# Patient Record
Sex: Male | Born: 1980 | Race: White | Hispanic: No | Marital: Single | Smoking: Current every day smoker
Health system: Southern US, Community
[De-identification: ages and names within clinical notes are randomized; demographics above are authoritative.]

## PROBLEM LIST (undated history)

## (undated) DIAGNOSIS — I10 Essential (primary) hypertension: Secondary | ICD-10-CM

## (undated) DIAGNOSIS — K7290 Hepatic failure, unspecified without coma: Secondary | ICD-10-CM

## (undated) DIAGNOSIS — F1010 Alcohol abuse, uncomplicated: Secondary | ICD-10-CM

---

## 2013-08-23 ENCOUNTER — Emergency Department: Payer: Self-pay | Admitting: Emergency Medicine

## 2013-08-23 LAB — COMPREHENSIVE METABOLIC PANEL
ANION GAP: 6 — AB (ref 7–16)
Albumin: 3.7 g/dL (ref 3.4–5.0)
Alkaline Phosphatase: 91 U/L
BUN: 9 mg/dL (ref 7–18)
Bilirubin,Total: 0.2 mg/dL (ref 0.2–1.0)
CHLORIDE: 100 mmol/L (ref 98–107)
Calcium, Total: 8.8 mg/dL (ref 8.5–10.1)
Co2: 31 mmol/L (ref 21–32)
Creatinine: 0.94 mg/dL (ref 0.60–1.30)
EGFR (Non-African Amer.): >60
Glucose: 105 mg/dL — ABNORMAL HIGH (ref 65–99)
Osmolality: 273 (ref 275–301)
POTASSIUM: 5 mmol/L (ref 3.5–5.1)
SGOT(AST): 32 U/L (ref 15–37)
SGPT (ALT): 32 U/L
Sodium: 137 mmol/L (ref 136–145)
Total Protein: 8.8 g/dL — ABNORMAL HIGH (ref 6.4–8.2)

## 2013-08-23 LAB — URINALYSIS, COMPLETE
BLOOD: NEGATIVE
Bilirubin,UR: NEGATIVE
Glucose,UR: NEGATIVE mg/dL (ref 0–75)
KETONE: NEGATIVE
Leukocyte Esterase: NEGATIVE
Nitrite: NEGATIVE
PH: 5 (ref 4.5–8.0)
Protein: NEGATIVE
RBC,UR: NONE SEEN /HPF (ref 0–5)
Specific Gravity: 1.015 (ref 1.003–1.030)
Squamous Epithelial: NONE SEEN
WBC UR: NONE SEEN /HPF (ref 0–5)

## 2013-08-23 LAB — DRUG SCREEN, URINE
AMPHETAMINES, UR SCREEN: NEGATIVE (ref ?–1000)
BARBITURATES, UR SCREEN: NEGATIVE (ref ?–200)
BENZODIAZEPINE, UR SCRN: POSITIVE (ref ?–200)
Cannabinoid 50 Ng, Ur ~~LOC~~: NEGATIVE (ref ?–50)
Cocaine Metabolite,Ur ~~LOC~~: NEGATIVE (ref ?–300)
MDMA (Ecstasy)Ur Screen: NEGATIVE (ref ?–500)
Methadone, Ur Screen: POSITIVE (ref ?–300)
OPIATE, UR SCREEN: NEGATIVE (ref ?–300)
PHENCYCLIDINE (PCP) UR S: NEGATIVE (ref ?–25)
Tricyclic, Ur Screen: NEGATIVE (ref ?–1000)

## 2013-08-23 LAB — CBC
HCT: 46.3 % (ref 40.0–52.0)
HGB: 15.4 g/dL (ref 13.0–18.0)
MCH: 31.5 pg (ref 26.0–34.0)
MCHC: 33.3 g/dL (ref 32.0–36.0)
MCV: 95 fL (ref 80–100)
PLATELETS: 254 10*3/uL (ref 150–440)
RBC: 4.89 10*6/uL (ref 4.40–5.90)
RDW: 12.4 % (ref 11.5–14.5)
WBC: 9.8 10*3/uL (ref 3.8–10.6)

## 2013-08-23 LAB — SALICYLATE LEVEL: SALICYLATES, SERUM: 6.6 mg/dL — AB

## 2013-08-23 LAB — ETHANOL

## 2013-08-23 LAB — ACETAMINOPHEN LEVEL: ACETAMINOPHEN: 8 ug/mL — AB

## 2018-03-05 ENCOUNTER — Encounter (HOSPITAL_COMMUNITY): Payer: Self-pay | Admitting: Emergency Medicine

## 2018-03-05 ENCOUNTER — Other Ambulatory Visit: Payer: Self-pay

## 2018-03-05 ENCOUNTER — Inpatient Hospital Stay (HOSPITAL_COMMUNITY)
Admission: EM | Admit: 2018-03-05 | Discharge: 2018-03-12 | DRG: 432 | Disposition: A | Discharge status: Other Acute Inpatient Hospital | Payer: Medicaid Other | Source: Ambulatory Visit | Attending: Internal Medicine | Admitting: Internal Medicine

## 2018-03-05 DIAGNOSIS — R05 Cough: Secondary | ICD-10-CM | POA: Diagnosis not present

## 2018-03-05 DIAGNOSIS — Y95 Nosocomial condition: Secondary | ICD-10-CM | POA: Diagnosis not present

## 2018-03-05 DIAGNOSIS — E8770 Fluid overload, unspecified: Secondary | ICD-10-CM | POA: Diagnosis not present

## 2018-03-05 DIAGNOSIS — F1010 Alcohol abuse, uncomplicated: Secondary | ICD-10-CM | POA: Diagnosis not present

## 2018-03-05 DIAGNOSIS — K7011 Alcoholic hepatitis with ascites: Secondary | ICD-10-CM | POA: Diagnosis present

## 2018-03-05 DIAGNOSIS — E8809 Other disorders of plasma-protein metabolism, not elsewhere classified: Secondary | ICD-10-CM

## 2018-03-05 DIAGNOSIS — N289 Disorder of kidney and ureter, unspecified: Secondary | ICD-10-CM

## 2018-03-05 DIAGNOSIS — K920 Hematemesis: Secondary | ICD-10-CM | POA: Diagnosis present

## 2018-03-05 DIAGNOSIS — I10 Essential (primary) hypertension: Secondary | ICD-10-CM | POA: Diagnosis present

## 2018-03-05 DIAGNOSIS — Z978 Presence of other specified devices: Secondary | ICD-10-CM

## 2018-03-05 DIAGNOSIS — R6521 Severe sepsis with septic shock: Secondary | ICD-10-CM | POA: Diagnosis not present

## 2018-03-05 DIAGNOSIS — D689 Coagulation defect, unspecified: Secondary | ICD-10-CM | POA: Diagnosis present

## 2018-03-05 DIAGNOSIS — F1020 Alcohol dependence, uncomplicated: Secondary | ICD-10-CM

## 2018-03-05 DIAGNOSIS — D649 Anemia, unspecified: Secondary | ICD-10-CM | POA: Diagnosis present

## 2018-03-05 DIAGNOSIS — R34 Anuria and oliguria: Secondary | ICD-10-CM | POA: Diagnosis not present

## 2018-03-05 DIAGNOSIS — J9690 Respiratory failure, unspecified, unspecified whether with hypoxia or hypercapnia: Secondary | ICD-10-CM

## 2018-03-05 DIAGNOSIS — E871 Hypo-osmolality and hyponatremia: Secondary | ICD-10-CM | POA: Diagnosis present

## 2018-03-05 DIAGNOSIS — K7040 Alcoholic hepatic failure without coma: Principal | ICD-10-CM | POA: Diagnosis present

## 2018-03-05 DIAGNOSIS — F419 Anxiety disorder, unspecified: Secondary | ICD-10-CM | POA: Diagnosis present

## 2018-03-05 DIAGNOSIS — N179 Acute kidney failure, unspecified: Secondary | ICD-10-CM | POA: Diagnosis present

## 2018-03-05 DIAGNOSIS — R059 Cough, unspecified: Secondary | ICD-10-CM

## 2018-03-05 DIAGNOSIS — S025XXA Fracture of tooth (traumatic), initial encounter for closed fracture: Secondary | ICD-10-CM | POA: Diagnosis present

## 2018-03-05 DIAGNOSIS — B1920 Unspecified viral hepatitis C without hepatic coma: Secondary | ICD-10-CM | POA: Diagnosis present

## 2018-03-05 DIAGNOSIS — R188 Other ascites: Secondary | ICD-10-CM

## 2018-03-05 DIAGNOSIS — K7031 Alcoholic cirrhosis of liver with ascites: Secondary | ICD-10-CM | POA: Diagnosis present

## 2018-03-05 DIAGNOSIS — K7290 Hepatic failure, unspecified without coma: Secondary | ICD-10-CM | POA: Diagnosis not present

## 2018-03-05 DIAGNOSIS — D6959 Other secondary thrombocytopenia: Secondary | ICD-10-CM | POA: Diagnosis present

## 2018-03-05 DIAGNOSIS — J189 Pneumonia, unspecified organism: Secondary | ICD-10-CM | POA: Diagnosis not present

## 2018-03-05 DIAGNOSIS — J9601 Acute respiratory failure with hypoxia: Secondary | ICD-10-CM | POA: Diagnosis not present

## 2018-03-05 DIAGNOSIS — K921 Melena: Secondary | ICD-10-CM | POA: Diagnosis present

## 2018-03-05 DIAGNOSIS — A419 Sepsis, unspecified organism: Secondary | ICD-10-CM | POA: Diagnosis not present

## 2018-03-05 DIAGNOSIS — F17210 Nicotine dependence, cigarettes, uncomplicated: Secondary | ICD-10-CM | POA: Diagnosis present

## 2018-03-05 DIAGNOSIS — F1190 Opioid use, unspecified, uncomplicated: Secondary | ICD-10-CM | POA: Diagnosis present

## 2018-03-05 DIAGNOSIS — K279 Peptic ulcer, site unspecified, unspecified as acute or chronic, without hemorrhage or perforation: Secondary | ICD-10-CM | POA: Diagnosis present

## 2018-03-05 DIAGNOSIS — E860 Dehydration: Secondary | ICD-10-CM | POA: Diagnosis present

## 2018-03-05 DIAGNOSIS — K029 Dental caries, unspecified: Secondary | ICD-10-CM | POA: Diagnosis present

## 2018-03-05 DIAGNOSIS — I361 Nonrheumatic tricuspid (valve) insufficiency: Secondary | ICD-10-CM | POA: Diagnosis not present

## 2018-03-05 HISTORY — DX: Essential (primary) hypertension: I10

## 2018-03-05 HISTORY — DX: Hepatic failure, unspecified without coma: K72.90

## 2018-03-05 HISTORY — DX: Alcohol abuse, uncomplicated: F10.10

## 2018-03-05 LAB — CBC
HCT: 31.1 % — ABNORMAL LOW (ref 39.0–52.0)
Hemoglobin: 11.5 g/dL — ABNORMAL LOW (ref 13.0–17.0)
MCH: 35.6 pg — ABNORMAL HIGH (ref 26.0–34.0)
MCHC: 37 g/dL — ABNORMAL HIGH (ref 30.0–36.0)
MCV: 96.3 fL (ref 80.0–100.0)
NRBC: 0 % (ref 0.0–0.2)
Platelets: 107 10*3/uL — ABNORMAL LOW (ref 150–400)
RBC: 3.23 MIL/uL — ABNORMAL LOW (ref 4.22–5.81)
RDW: 16.1 % — ABNORMAL HIGH (ref 11.5–15.5)
WBC: 12.5 10*3/uL — AB (ref 4.0–10.5)

## 2018-03-05 LAB — BASIC METABOLIC PANEL
Anion gap: 12 (ref 5–15)
BUN: 27 mg/dL — ABNORMAL HIGH (ref 6–20)
CO2: 24 mmol/L (ref 22–32)
Calcium: 8.4 mg/dL — ABNORMAL LOW (ref 8.9–10.3)
Chloride: 80 mmol/L — ABNORMAL LOW (ref 98–111)
Creatinine, Ser: 3.11 mg/dL — ABNORMAL HIGH (ref 0.61–1.24)
GFR calc Af Amer: 28 mL/min — ABNORMAL LOW (ref >60)
GFR calc non Af Amer: 24 mL/min — ABNORMAL LOW (ref >60)
GLUCOSE: 98 mg/dL (ref 70–99)
Potassium: 4.8 mmol/L (ref 3.5–5.1)
Sodium: 116 mmol/L — CL (ref 135–145)

## 2018-03-05 LAB — TYPE AND SCREEN
ABO/RH(D): A POS
Antibody Screen: NEGATIVE

## 2018-03-05 LAB — MAGNESIUM: MAGNESIUM: 2.2 mg/dL (ref 1.7–2.4)

## 2018-03-05 LAB — COMPREHENSIVE METABOLIC PANEL
ALT: 91 U/L — ABNORMAL HIGH (ref 0–44)
AST: 282 U/L — ABNORMAL HIGH (ref 15–41)
Albumin: 2.3 g/dL — ABNORMAL LOW (ref 3.5–5.0)
Alkaline Phosphatase: 101 U/L (ref 38–126)
Anion gap: 13 (ref 5–15)
BUN: 27 mg/dL — ABNORMAL HIGH (ref 6–20)
CO2: 22 mmol/L (ref 22–32)
Calcium: 8.5 mg/dL — ABNORMAL LOW (ref 8.9–10.3)
Chloride: 80 mmol/L — ABNORMAL LOW (ref 98–111)
Creatinine, Ser: 3.02 mg/dL — ABNORMAL HIGH (ref 0.61–1.24)
GFR calc Af Amer: 29 mL/min — ABNORMAL LOW (ref >60)
GFR, EST NON AFRICAN AMERICAN: 25 mL/min — AB (ref >60)
Glucose, Bld: 108 mg/dL — ABNORMAL HIGH (ref 70–99)
Potassium: 4.7 mmol/L (ref 3.5–5.1)
Sodium: 115 mmol/L — CL (ref 135–145)
Total Bilirubin: 22.6 mg/dL (ref 0.3–1.2)
Total Protein: 7.1 g/dL (ref 6.5–8.1)

## 2018-03-05 LAB — BILIRUBIN, FRACTIONATED(TOT/DIR/INDIR)
Bilirubin, Direct: 13.6 mg/dL — ABNORMAL HIGH (ref 0.0–0.2)
Indirect Bilirubin: 8 mg/dL — ABNORMAL HIGH (ref 0.3–0.9)
Total Bilirubin: 21.6 mg/dL (ref 0.3–1.2)

## 2018-03-05 LAB — TSH: TSH: 1.771 u[IU]/mL (ref 0.350–4.500)

## 2018-03-05 LAB — PHOSPHORUS: Phosphorus: 5.6 mg/dL — ABNORMAL HIGH (ref 2.5–4.6)

## 2018-03-05 LAB — ETHANOL: Alcohol, Ethyl (B): 22 mg/dL — ABNORMAL HIGH (ref <10)

## 2018-03-05 MED ORDER — ACETAMINOPHEN 650 MG RE SUPP: 650.0000 mg | Freq: Four times a day (QID) | RECTAL | Status: DC | PRN

## 2018-03-05 MED ORDER — FOLIC ACID 1 MG PO TABS
1.0000 mg | ORAL_TABLET | Freq: Every day | ORAL | Status: DC
  Administered 2018-03-06 – 2018-03-12 (×6): 1 mg via ORAL
  Filled 2018-03-05 (×8): qty 1

## 2018-03-05 MED ORDER — METHADONE HCL 10 MG/ML PO CONC
50.0000 mg | Freq: Every morning | ORAL | Status: DC
  Filled 2018-03-05: qty 5

## 2018-03-05 MED ORDER — ACETAMINOPHEN 325 MG PO TABS: 650.0000 mg | ORAL_TABLET | Freq: Four times a day (QID) | ORAL | Status: DC | PRN

## 2018-03-05 MED ORDER — ADULT MULTIVITAMIN W/MINERALS CH
1.0000 | ORAL_TABLET | Freq: Every day | ORAL | Status: DC
  Administered 2018-03-06 – 2018-03-12 (×7): 1 via ORAL
  Filled 2018-03-05 (×6): qty 1

## 2018-03-05 MED ORDER — THIAMINE HCL 100 MG/ML IJ SOLN
100.0000 mg | Freq: Every day | INTRAMUSCULAR | Status: DC
  Administered 2018-03-06: 100 mg via INTRAVENOUS
  Filled 2018-03-05: qty 2

## 2018-03-05 MED ORDER — PANTOPRAZOLE SODIUM 40 MG IV SOLR
40.0000 mg | Freq: Two times a day (BID) | INTRAVENOUS | Status: DC
  Administered 2018-03-05 – 2018-03-08 (×6): 40 mg via INTRAVENOUS
  Filled 2018-03-05 (×6): qty 40

## 2018-03-05 MED ORDER — LORAZEPAM 2 MG/ML IJ SOLN
0.0000 mg | Freq: Two times a day (BID) | INTRAMUSCULAR | Status: AC
  Administered 2018-03-08: 2 mg via INTRAVENOUS
  Filled 2018-03-05 (×2): qty 1

## 2018-03-05 MED ORDER — SODIUM CHLORIDE 0.9 % IV BOLUS
1000.0000 mL | Freq: Once | INTRAVENOUS | Status: AC
  Administered 2018-03-05: 1000 mL via INTRAVENOUS

## 2018-03-05 MED ORDER — METHYLPREDNISOLONE SODIUM SUCC 40 MG IJ SOLR
40.0000 mg | Freq: Every day | INTRAMUSCULAR | Status: DC
  Administered 2018-03-05: 40 mg via INTRAVENOUS
  Filled 2018-03-05: qty 1

## 2018-03-05 MED ORDER — LORAZEPAM 2 MG/ML IJ SOLN
0.0000 mg | Freq: Four times a day (QID) | INTRAMUSCULAR | Status: AC
  Administered 2018-03-06: 2 mg via INTRAVENOUS
  Administered 2018-03-07 (×2): 1 mg via INTRAVENOUS
  Filled 2018-03-05 (×3): qty 1

## 2018-03-05 MED ORDER — LORAZEPAM 1 MG PO TABS
1.0000 mg | ORAL_TABLET | Freq: Four times a day (QID) | ORAL | Status: AC | PRN
  Administered 2018-03-08: 1 mg via ORAL
  Filled 2018-03-05: qty 1

## 2018-03-05 MED ORDER — ONDANSETRON HCL 4 MG/2ML IJ SOLN
4.0000 mg | Freq: Four times a day (QID) | INTRAMUSCULAR | Status: DC | PRN
  Administered 2018-03-06 – 2018-03-10 (×4): 4 mg via INTRAVENOUS
  Filled 2018-03-05 (×4): qty 2

## 2018-03-05 MED ORDER — SODIUM CHLORIDE 0.9 % IV SOLN
1.0000 g | INTRAVENOUS | Status: DC
  Administered 2018-03-06 – 2018-03-08 (×4): 1 g via INTRAVENOUS
  Filled 2018-03-05 (×4): qty 10

## 2018-03-05 MED ORDER — DEXTROSE-NACL 5-0.9 % IV SOLN
INTRAVENOUS | Status: DC
  Administered 2018-03-06 – 2018-03-07 (×3): via INTRAVENOUS

## 2018-03-05 MED ORDER — CITALOPRAM HYDROBROMIDE 20 MG PO TABS: 40.0000 mg | ORAL_TABLET | Freq: Every day | ORAL | Status: DC

## 2018-03-05 MED ORDER — VITAMIN B-1 100 MG PO TABS
100.0000 mg | ORAL_TABLET | Freq: Every day | ORAL | Status: DC
  Administered 2018-03-07 – 2018-03-12 (×6): 100 mg via ORAL
  Filled 2018-03-05 (×7): qty 1

## 2018-03-05 MED ORDER — ONDANSETRON HCL 4 MG PO TABS: 4.0000 mg | ORAL_TABLET | Freq: Four times a day (QID) | ORAL | Status: DC | PRN

## 2018-03-05 MED ORDER — SODIUM CHLORIDE 0.9 % IV SOLN
INTRAVENOUS | Status: DC
  Administered 2018-03-05: 18:00:00 via INTRAVENOUS

## 2018-03-05 MED ORDER — POLYETHYLENE GLYCOL 3350 17 G PO PACK: 17.0000 g | PACK | Freq: Every day | ORAL | Status: DC | PRN

## 2018-03-05 MED ORDER — LORAZEPAM 2 MG/ML IJ SOLN
1.0000 mg | Freq: Four times a day (QID) | INTRAMUSCULAR | Status: AC | PRN
  Administered 2018-03-05 – 2018-03-07 (×3): 1 mg via INTRAVENOUS
  Filled 2018-03-05 (×3): qty 1

## 2018-03-05 NOTE — H&P (Signed)
History and Physical    Momen Grosso KMQ:[id number] DOB: 08-26-1980 DOA: 03/05/2018  PCP: Smith Robert, MD   Patient coming from: Home  I have personally briefly reviewed patient's old medical records in [facility]  Chief Complaint: Jaundice, vomiting blood,    HPI: Azahel Rish is a 38 y.o. male with medical history significant for alcohol abuse, HTN, was sent to the ED by his primary care provider after blood work showed low sodium.  Patient reports noticing skin color change about 2 weeks ago, reports vomiting blood last episode this morning 3 times, black stools and loose stools over the past month.  Averages 3-4 stools daily.  Reports poor p.o. intake and fatigue over the past month.  Reports gradual abdominal swelling with tightness over the past 3 months, without leg swelling, no difficulty breathing.  He reports abdominal pain right upper quadrant and epigastric area. No chest pain, no difficulty breathing.  No seizures, no confusion.  Patient lives with his father who is present at bedside.  Patient admits to drinking at least 10 beers every day.  He tells me his last drink was yesterday- 1 beer, because he is trying to cut down.  He started drinking at the age of 78.  He drinks to avoid going into withdrawal. Has been on methadone since 2015, goes to a pain clinic in [place], [facility].  Denies IV drug use.  ED Course: soft blood pressure-systolic 102. Hyponatremia 115, elevated creatinine 3, hyperbilirubinemia- 22, elevated AST 282, ALT 91, Normal ALP 101. Hgb 11.5.  WBC 12.5.  Platelets 107.  Started on normal saline at 125 cc/h.  At the time of my evaluation, patient had barely gotten 500 mill total after ~5 hrs. EDP to Dr. Darrick Penna, OK with admission here, for treatment of alcoholic hepatitis.  Review of Systems: As per HPI all other systems reviewed and negative.  Past Medical History:  Diagnosis Date  . Alcohol abuse   . Hypertension   .  Liver failure (HCC)     History reviewed. No pertinent surgical history.   reports that he has been smoking cigarettes. He has been smoking about 1.50 packs per day. He has never used smokeless tobacco. He reports current alcohol use. He reports previous drug use.  No Known Allergies  No family history of liver disease.  Prior to Admission medications   Medication Sig Start Date End Date Taking? Authorizing Provider  acetaminophen (TYLENOL) 500 MG tablet Take 500-1,000 mg by mouth daily as needed for headache.   Yes [provider]  citalopram (CELEXA) 40 MG tablet Take 40 mg by mouth daily. 02/22/18  Yes [provider]  lisinopril (PRINIVIL,ZESTRIL) 20 MG tablet Take 20 mg by mouth daily. 02/22/18  Yes [provider]  LORazepam (ATIVAN) 1 MG tablet Take 1 mg by mouth daily as needed for anxiety.  02/22/18  Yes [provider]  methadone (DOLOPHINE) 10 MG/ML solution Take 50 mg by mouth every morning.   Yes [provider]  Vitamin D, Ergocalciferol, (DRISDOL) 1.25 MG (50000 UT) CAPS capsule Take 1 capsule by mouth every Sunday.    Yes [provider]    Physical Exam: Vitals:   03/05/18 1800 03/05/18 1830 03/05/18 1900 03/05/18 2000  BP: 109/62 108/62 121/78 (!) 106/59  Pulse: 83 84 92 85  Resp: (!) 8 (!) 9 14 14   Temp:      TempSrc:      SpO2: 92% 91% 93% 91%  Weight:  Height:        Constitutional: calm, comfortable, Icteric, Chronically ill-appearing Vitals:   03/05/18 1800 03/05/18 1830 03/05/18 1900 03/05/18 2000  BP: 109/62 108/62 121/78 (!) 106/59  Pulse: 83 84 92 85  Resp: (!) 8 (!) 9 14 14   Temp:      TempSrc:      SpO2: 92% 91% 93% 91%  Weight:      Height:       Eyes: PERRL, lids normal,, icteric conjunctiva ENMT: Mucous membranes are dry. Posterior pharynx clear of any exudate or lesions.  Neck: normal, supple, no masses, no thyromegaly Respiratory: clear to auscultation bilaterally, no wheezing, no  crackles. Normal respiratory effort. No accessory muscle use.  Cardiovascular: Regular rate and rhythm, no murmurs / rubs / gallops. No extremity edema. 2+ pedal pulses. No carotid bruits.  Abdomen: Distended, no tenderness, unable to palpate internal organs. Bowel sounds positive.  Musculoskeletal: no clubbing / cyanosis. No joint deformity upper and lower extremities. Good ROM, no contractures. Normal muscle tone.  Skin: no rashes, lesions, ulcers. No induration Neurologic: CN 2-12 grossly intact.  Strength 5/5 in all 4.  Psychiatric: Normal judgment and insight. Alert and oriented x 3. Normal mood.   Labs on Admission: I have personally reviewed following labs and imaging studies  CBC: Recent Labs  Lab 03/05/18 1610  WBC 12.5*  HGB 11.5*  HCT 31.1*  MCV 96.3  PLT 107*   Basic Metabolic Panel: Recent Labs  Lab 03/05/18 1610  NA 115*  K 4.7  CL 80*  CO2 22  GLUCOSE 108*  BUN 27*  CREATININE 3.02*  CALCIUM 8.5*   Liver Function Tests: Recent Labs  Lab 03/05/18 1610  AST 282*  ALT 91*  ALKPHOS 101  BILITOT 22.6*  PROT 7.1  ALBUMIN 2.3*    Radiological Exams on Admission: No results found.  EKG: None   Assessment/Plan Active Problems:   Hyponatremia   Hyponatremia-sodium 115 > 116, after barely of N/s.  Asymptomatic. per Care everywhere sodium 08/2017-  135.  Differentials likely prerenal vs beer portal mania.  He appears dehydrated clinically from vomiting and loose stools but has marked abdominal distention likely from ascites.   -1 L bolus -Cont D5 Normal saline at 125cc/hr  -Urine osmolality -Serum osmolality -Urine Sodium -TSH - BMP q6H X 4  Acute kidney injury- Cr 3. 08/2017- Cr 0.69.  Likely prerenal, with ongoing lisinopril use.  Differentials include hepatorenal syndrome. -Hydrate - BMP q6H x 4  Alcoholic hepatitis Vs liver failure-bilirubin 22.  With vomiting of blood, melena, Abdominal distention-ascites. ED provider talked to GI, okay  with admission here, treat for alcoholic hepatitis -Gastroenterology consult- order placed - NPO -Right upper quadrant ultrasound - Would benefit from paracentesis after RUQ [place] -IV Protonix 40 twice daily -PT- INR -Fractionated bilirubin -Acute hepatitis panel -IV Solu-Medrol 40 daily -Liver panel a.m. -Ceftriaxone 1 g daily for SBP prophylaxis in the setting of possible upper GI bleed - CBC a.m  Alcohol abuse- high risk for withdrawal.  Last drink yesterday.  But serum alcohol level is 22.  Denies seizure history. 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 Pain Clinic- [facility] number [phone]. -Continue home methadone - UDS  HTN-Hypotensive to soft blood pressure -Hold Lisinopril  HIV as part of routine health screening  DVT prophylaxis: Scds Code Status: Full Family Communication: Father at bedside Disposition Plan: Per rounding team Consults called: GI  Admission status: Inpt, Stepdown   Onnie Boer MD [facility]  03/05/2018, 11:58 PM

## 2018-03-05 NOTE — ED Provider Notes (Signed)
[facility] EMERGENCY DEPARTMENT Provider Note   CSN: [id number] Arrival date & time: 03/05/18  1537    History   Chief Complaint Chief Complaint  Patient presents with  . Jaundice    HPI Charles Hebert is a 38 y.o. male.     HPI   Patient states his doctor sent him here for evaluation of worsening ascites, worsening liver failure, and low sodium.  He had labs done yesterday at his PCP's office.  Patient reports mild soreness in abdomen.  He denies shortness of breath leg edema, focal weakness or paresthesia.  He has not had any fever, cough or chest pain.  He is taking a blood pressure medication as his only prescription.  He is unsure how long he has had liver problems.  Patient has been previously noted to be an alcoholic.  There are no other known modifying factors. Past Medical History:  Diagnosis Date  . Alcohol abuse   . Hypertension   . Liver failure (HCC)     There are no active problems to display for this patient.   History reviewed. No pertinent surgical history.      Home Medications    Prior to Admission medications   Not on File    Family History No family history on file.  Social History Social History   Tobacco Use  . Smoking status: Current Every Day Smoker    Packs/day: 1.50    Types: Cigarettes  . Smokeless tobacco: Never Used  Substance Use Topics  . Alcohol use: Yes    Comment: pt reports drinking #10 16oz cans of red apple ales daily, heavy for past 5 years  . Drug use: Not Currently    Comment: hx opiate abuse, on methadones     Allergies   Patient has no known allergies.   Review of Systems Review of Systems  All other systems reviewed and are negative.    Physical Exam Updated Vital Signs BP 121/78   Pulse 92   Temp 97.6 F (36.4 C) (Oral)   Resp 14   Ht 6' (1.829 m)   Wt 95.3 kg   SpO2 93%   BMI 28.48 kg/m    Physical Exam Vitals signs and nursing note reviewed.  Constitutional:      General: He is not  in acute distress.    Appearance: He is well-developed. He is ill-appearing. He is not toxic-appearing or diaphoretic.     Comments: Appears older than stated age.  HENT:     Head: Normocephalic and atraumatic.     Right Ear: External ear normal.     Left Ear: External ear normal.     Nose: No congestion or rhinorrhea.     Mouth/Throat:     Mouth: Mucous membranes are moist.     Pharynx: No oropharyngeal exudate or posterior oropharyngeal erythema.  Eyes:     General: Scleral icterus present.     Conjunctiva/sclera: Conjunctivae normal.     Pupils: Pupils are equal, round, and reactive to light.  Neck:     Musculoskeletal: Normal range of motion and neck supple.     Trachea: Phonation normal.  Cardiovascular:     Rate and Rhythm: Normal rate and regular rhythm.     Heart sounds: Normal heart sounds.  Pulmonary:     Effort: Pulmonary effort is normal.     Breath sounds: Normal breath sounds.  Abdominal:     General: There is distension.     Palpations: Abdomen is soft.  There is no mass.     Tenderness: There is no abdominal tenderness. There is no rebound.     Hernia: No hernia is present.  Musculoskeletal: Normal range of motion.     Right lower leg: No edema.     Left lower leg: No edema.  Skin:    General: Skin is warm and dry.  Neurological:     Mental Status: He is alert and oriented to person, place, and time.     Cranial Nerves: No cranial nerve deficit.     Sensory: No sensory deficit.     Motor: No abnormal muscle tone.     Coordination: Coordination normal.     Comments: No dysarthria or aphasia  Psychiatric:        Behavior: Behavior normal.        Thought Content: Thought content normal.        Judgment: Judgment normal.      ED Treatments / Results  Labs (all labs ordered are listed, but only abnormal results are displayed) Labs Reviewed  COMPREHENSIVE METABOLIC PANEL - Abnormal; Notable for the following components:      Result Value   Sodium 115  (*)    Chloride 80 (*)    Glucose, Bld 108 (*)    BUN 27 (*)    Creatinine, Ser 3.02 (*)    Calcium 8.5 (*)    Albumin 2.3 (*)    AST 282 (*)    ALT 91 (*)    Total Bilirubin 22.6 (*)    GFR calc non Af Amer 25 (*)    GFR calc Af Amer 29 (*)    All other components within normal limits  CBC - Abnormal; Notable for the following components:   WBC 12.5 (*)    RBC 3.23 (*)    Hemoglobin 11.5 (*)    HCT 31.1 (*)    MCH 35.6 (*)    MCHC 37.0 (*)    RDW 16.1 (*)    Platelets 107 (*)    All other components within normal limits  POC OCCULT BLOOD, ED  TYPE AND SCREEN    EKG None  Radiology No results found.  Procedures .Critical Care  Performed by: Mancel Bale, MD  Authorized by: Mancel Bale, MD   Critical care provider statement:    Critical care time (minutes):  35   Critical care start time:  03/05/2018 4:02 PM   Critical care end time:  03/05/2018 5:42 PM   Critical care time was exclusive of:  Separately billable procedures and treating other patients   Critical care was necessary to treat or prevent imminent or life-threatening deterioration of the following conditions:  Hepatic failure   Critical care was time spent personally by me on the following activities:  Blood draw for specimens, development of treatment plan with patient or surrogate, discussions with consultants, evaluation of patient's response to treatment, examination of patient, obtaining history from patient or surrogate, ordering and performing treatments and interventions, ordering and review of laboratory studies, pulse oximetry, re-evaluation of patient's condition, review of old charts and ordering and review of radiographic studies   (including critical care time)  Medications Ordered in ED Medications  0.9 %  sodium chloride infusion ( Intravenous New Bag/Given 03/05/18 1756)     Initial Impression / Assessment and Plan / ED Course  I have reviewed the triage vital signs and the nursing  notes.  Pertinent labs & imaging results that were available during my care of  the patient were reviewed by me and considered in my medical decision making (see chart for details).  Clinical Course as of Mar 05 1912  Tue Mar 05, 2018  1735 Normal except sodium low, chloride low, glucose low, BUN low, creatinine high, calcium low, albumin low, AST high, ALT high, total bilirubin high, GFR low  Comprehensive metabolic panel(!!) [EW]  1735 Normal except white count high, hemoglobin low, RBC indices low, platelets low  CBC(!) [EW]  1913 Noted  Type and screen [facility] [EW]    Clinical Course User Index [EW] Mancel BaleWentz, Lashunda Greis, MD        Patient Vitals for the past 24 hrs:  BP Temp Temp src Pulse Resp SpO2 Height Weight  03/05/18 1900 121/78 - - 92 14 93 % - -  03/05/18 1830 108/62 - - 84 (!) 9 91 % - -  03/05/18 1800 109/62 - - 83 (!) 8 92 % - -  03/05/18 1730 113/65 - - 88 10 93 % - -  03/05/18 1553 - - - - - - 6' (1.829 m) 95.3 kg  03/05/18 1552 102/71 97.6 F (36.4 C) Oral 93 16 94 % - -    5:36 PM Reevaluation with update and discussion. After initial assessment and treatment, an updated evaluation reveals he has been able ambulate here.  He has no further complaints.  Findings discussed and questions answered. Mancel BaleElliott Teylor Wolven   Medical Decision Making: Liver failure, nonspecific, apparently been treated by PCP without improvement.  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  Doubt acute active progressive hepatitis.  Patient with significant hyponatremia, hypochloremia, and renal insufficiency.  He is likely overall vascular volume depleted.  Records requested from patient's PCP not available as of  5:30 p.m. patient takes an unknown antihypertensive, unknown if it is a diuretic.  Patient with very low sodium level that needs supplementation with likely overall volume depletion, evidenced by increased BUN and creatinine.  Doubt acute hepatorenal syndrome.   CRITICAL  CARE- yes Performed by: Mancel BaleElliott Normal Recinos   Nursing Notes Reviewed/ Care Coordinated Applicable Imaging Reviewed Interpretation of Laboratory Data incorporated into ED treatment   5:38 PM-Consult complete with hospitalist. Patient case explained and discussed.  She agrees to admit patient for further evaluation and treatment. Call ended at 1820  Case discussed with gastroenterology, Dr. Darrick Pennafields, who states that the patient can be managed here at this facility for care and treatment of alcohol related hepatitis.  Plan: Admit  Final Clinical Impressions(s) / ED Diagnoses   Final diagnoses:  Hyperbilirubinemia  Hyponatremia  Renal insufficiency  Hypoalbuminemia  Alcoholism (HCC)  Ascites due to alcoholic hepatitis    ED Discharge Orders    None      Mancel BaleWentz, Javante Nilsson, MD 03/05/18 1914

## 2018-03-05 NOTE — ED Notes (Signed)
CRITICAL VALUE ALERT  Critical Value:  Na 115, total Bilirubin 22.6  Date & Time Notied:  1711, 03/05/2018  Provider Notified: Dr. Effie Shy  Orders Received/Actions taken: no new orders

## 2018-03-05 NOTE — ED Triage Notes (Addendum)
Pt with hx of liver failure presents with jaundice (began 2-3 days ago), vomiting blood and bile x 1.5 months, blood in stools, loose stools, fatigue, and loss of appetite. Pt states PCP told him to come because his sodium is "extremely low."

## 2018-03-05 NOTE — ED Notes (Signed)
CRITICAL VALUE ALERT  Critical Value:  Sodium 116  Date & Time Notied:  03/05/2018 2102  Provider Notified: Dr Mariea Clonts  Orders Received/Actions taken: see chart

## 2018-03-06 ENCOUNTER — Inpatient Hospital Stay (HOSPITAL_COMMUNITY): Payer: Medicaid Other

## 2018-03-06 ENCOUNTER — Encounter (HOSPITAL_COMMUNITY): Payer: Self-pay

## 2018-03-06 DIAGNOSIS — F1010 Alcohol abuse, uncomplicated: Secondary | ICD-10-CM

## 2018-03-06 DIAGNOSIS — E871 Hypo-osmolality and hyponatremia: Secondary | ICD-10-CM

## 2018-03-06 DIAGNOSIS — K7290 Hepatic failure, unspecified without coma: Secondary | ICD-10-CM

## 2018-03-06 LAB — BASIC METABOLIC PANEL
Anion gap: 11 (ref 5–15)
Anion gap: 9 (ref 5–15)
Anion gap: 9 (ref 5–15)
Anion gap: 9 (ref 5–15)
BUN: 26 mg/dL — AB (ref 6–20)
BUN: 27 mg/dL — ABNORMAL HIGH (ref 6–20)
BUN: 27 mg/dL — ABNORMAL HIGH (ref 6–20)
BUN: 28 mg/dL — AB (ref 6–20)
CHLORIDE: 89 mmol/L — AB (ref 98–111)
CHLORIDE: 91 mmol/L — AB (ref 98–111)
CO2: 20 mmol/L — ABNORMAL LOW (ref 22–32)
CO2: 21 mmol/L — ABNORMAL LOW (ref 22–32)
CO2: 22 mmol/L (ref 22–32)
CO2: 24 mmol/L (ref 22–32)
Calcium: 8.1 mg/dL — ABNORMAL LOW (ref 8.9–10.3)
Calcium: 8.1 mg/dL — ABNORMAL LOW (ref 8.9–10.3)
Calcium: 8.1 mg/dL — ABNORMAL LOW (ref 8.9–10.3)
Calcium: 8.2 mg/dL — ABNORMAL LOW (ref 8.9–10.3)
Chloride: 84 mmol/L — ABNORMAL LOW (ref 98–111)
Chloride: 85 mmol/L — ABNORMAL LOW (ref 98–111)
Creatinine, Ser: 2.07 mg/dL — ABNORMAL HIGH (ref 0.61–1.24)
Creatinine, Ser: 2.38 mg/dL — ABNORMAL HIGH (ref 0.61–1.24)
Creatinine, Ser: 2.78 mg/dL — ABNORMAL HIGH (ref 0.61–1.24)
Creatinine, Ser: 3.11 mg/dL — ABNORMAL HIGH (ref 0.61–1.24)
GFR calc Af Amer: 28 mL/min — ABNORMAL LOW (ref >60)
GFR calc Af Amer: 32 mL/min — ABNORMAL LOW (ref >60)
GFR calc Af Amer: 39 mL/min — ABNORMAL LOW (ref >60)
GFR calc Af Amer: 46 mL/min — ABNORMAL LOW (ref >60)
GFR calc non Af Amer: 24 mL/min — ABNORMAL LOW (ref >60)
GFR calc non Af Amer: 28 mL/min — ABNORMAL LOW (ref >60)
GFR calc non Af Amer: 33 mL/min — ABNORMAL LOW (ref >60)
GFR calc non Af Amer: 39 mL/min — ABNORMAL LOW (ref >60)
Glucose, Bld: 134 mg/dL — ABNORMAL HIGH (ref 70–99)
Glucose, Bld: 154 mg/dL — ABNORMAL HIGH (ref 70–99)
Glucose, Bld: 161 mg/dL — ABNORMAL HIGH (ref 70–99)
Glucose, Bld: 97 mg/dL (ref 70–99)
POTASSIUM: 5.1 mmol/L (ref 3.5–5.1)
Potassium: 4.8 mmol/L (ref 3.5–5.1)
Potassium: 4.8 mmol/L (ref 3.5–5.1)
Potassium: 4.9 mmol/L (ref 3.5–5.1)
Sodium: 117 mmol/L — CL (ref 135–145)
Sodium: 118 mmol/L — CL (ref 135–145)
Sodium: 119 mmol/L — CL (ref 135–145)
Sodium: 120 mmol/L — ABNORMAL LOW (ref 135–145)

## 2018-03-06 LAB — RAPID URINE DRUG SCREEN, HOSP PERFORMED
Amphetamines: NOT DETECTED
Barbiturates: NOT DETECTED
Benzodiazepines: POSITIVE — AB
Cocaine: NOT DETECTED
Opiates: NOT DETECTED
TETRAHYDROCANNABINOL: NOT DETECTED

## 2018-03-06 LAB — CBC
HCT: 29.9 % — ABNORMAL LOW (ref 39.0–52.0)
HEMOGLOBIN: 11 g/dL — AB (ref 13.0–17.0)
MCH: 35.9 pg — ABNORMAL HIGH (ref 26.0–34.0)
MCHC: 36.8 g/dL — ABNORMAL HIGH (ref 30.0–36.0)
MCV: 97.7 fL (ref 80.0–100.0)
Platelets: 94 10*3/uL — ABNORMAL LOW (ref 150–400)
RBC: 3.06 MIL/uL — ABNORMAL LOW (ref 4.22–5.81)
RDW: 15.7 % — ABNORMAL HIGH (ref 11.5–15.5)
WBC: 9.1 10*3/uL (ref 4.0–10.5)
nRBC: 0 % (ref 0.0–0.2)

## 2018-03-06 LAB — OSMOLALITY: Osmolality: 255 mOsm/kg — ABNORMAL LOW (ref 275–295)

## 2018-03-06 LAB — HEPATIC FUNCTION PANEL
ALK PHOS: 104 U/L (ref 38–126)
ALT: 91 U/L — ABNORMAL HIGH (ref 0–44)
AST: 246 U/L — ABNORMAL HIGH (ref 15–41)
Albumin: 2.1 g/dL — ABNORMAL LOW (ref 3.5–5.0)
Bilirubin, Direct: 14.8 mg/dL — ABNORMAL HIGH (ref 0.0–0.2)
Indirect Bilirubin: 7.8 mg/dL — ABNORMAL HIGH (ref 0.3–0.9)
Total Bilirubin: 22.6 mg/dL (ref 0.3–1.2)
Total Protein: 6.8 g/dL (ref 6.5–8.1)

## 2018-03-06 LAB — PROTIME-INR
INR: 1.6 — AB (ref 0.8–1.2)
Prothrombin Time: 18.8 seconds — ABNORMAL HIGH (ref 11.4–15.2)

## 2018-03-06 LAB — OSMOLALITY, URINE: Osmolality, Ur: 262 mOsm/kg — ABNORMAL LOW (ref 300–900)

## 2018-03-06 LAB — MRSA PCR SCREENING: MRSA by PCR: POSITIVE — AB

## 2018-03-06 LAB — SODIUM, URINE, RANDOM: Sodium, Ur: <10 mmol/L

## 2018-03-06 MED ORDER — CITALOPRAM HYDROBROMIDE 20 MG PO TABS
20.0000 mg | ORAL_TABLET | Freq: Every day | ORAL | Status: DC
  Administered 2018-03-06 – 2018-03-12 (×7): 20 mg via ORAL
  Filled 2018-03-06 (×7): qty 1

## 2018-03-06 MED ORDER — METHADONE HCL 10 MG PO TABS
50.0000 mg | ORAL_TABLET | Freq: Every day | ORAL | Status: DC
  Administered 2018-03-06 – 2018-03-09 (×4): 50 mg via ORAL
  Filled 2018-03-06 (×4): qty 5

## 2018-03-06 MED ORDER — MUPIROCIN 2 % EX OINT
1.0000 "application " | TOPICAL_OINTMENT | Freq: Two times a day (BID) | CUTANEOUS | Status: AC
  Administered 2018-03-06 – 2018-03-10 (×9): 1 via NASAL
  Filled 2018-03-06 (×2): qty 22

## 2018-03-06 MED ORDER — METHYLPREDNISOLONE SODIUM SUCC 40 MG IJ SOLR
40.0000 mg | Freq: Every day | INTRAMUSCULAR | Status: DC
  Administered 2018-03-06 – 2018-03-08 (×3): 40 mg via INTRAVENOUS
  Filled 2018-03-06 (×3): qty 1

## 2018-03-06 MED ORDER — VITAMIN K1 10 MG/ML IJ SOLN
10.0000 mg | Freq: Once | INTRAMUSCULAR | Status: AC
  Administered 2018-03-06: 10 mg via SUBCUTANEOUS
  Filled 2018-03-06: qty 1

## 2018-03-06 MED ORDER — CHLORHEXIDINE GLUCONATE CLOTH 2 % EX PADS
6.0000 | MEDICATED_PAD | Freq: Every day | CUTANEOUS | Status: AC
  Administered 2018-03-07 – 2018-03-10 (×4): 6 via TOPICAL

## 2018-03-06 MED ORDER — HYDRALAZINE HCL 20 MG/ML IJ SOLN: 10.0000 mg | INTRAMUSCULAR | Status: DC | PRN

## 2018-03-06 MED ORDER — PREDNISOLONE 5 MG PO TABS
40.0000 mg | ORAL_TABLET | Freq: Every day | ORAL | Status: DC
  Filled 2018-03-06 (×3): qty 8

## 2018-03-06 NOTE — Progress Notes (Signed)
PROGRESS NOTE    Charles Hebert  TKW:[id number] DOB: 07/28/1980 DOA: 03/05/2018 PCP: Smith Robert, MD   Brief Narrative:  Per HPI: Charles Hebert is a 38 y.o. male with medical history significant for alcohol abuse, HTN, was sent to the ED by his primary care provider after blood work showed low sodium.  Patient reports noticing skin color change about 2 weeks ago, reports vomiting blood last episode this morning 3 times, black stools and loose stools over the past month.  Averages 3-4 stools daily.  Reports poor p.o. intake and fatigue over the past month.  Reports gradual abdominal swelling with tightness over the past 3 months, without leg swelling, no difficulty breathing.  He reports abdominal pain right upper quadrant and epigastric area. No chest pain, no difficulty breathing.  No seizures, no confusion.  Patient lives with his father who is present at bedside.  Patient admits to drinking at least 10 beers every day.  He tells me his last drink was yesterday- 1 beer, because he is trying to cut down.  He started drinking at the age of 89.  He drinks to avoid going into withdrawal. Has been on methadone since 2015, goes to a pain clinic in [place], [facility].  Denies IV drug use.  Patient has been started on treatment for beer potomania with IV normal saline with improvement and sodium noted.  AKI is also improving.  GI consultation pending for alcoholic hepatitis/liver failure.  Hepatitis panel and right upper quadrant ultrasound pending.  Assessment & Plan:   Active Problems:   Hyponatremia  1. Alcoholic hepatitis with liver injury/failure.  Bilirubin continues to remain elevated as well as liver enzymes.  Appreciate GI evaluation with right upper quadrant ultrasound pending.  Will likely require paracentesis to help with abdominal distention.  Continue on IV Protonix.  Continue on Rocephin for SBP prophylaxis.  Continue on Solu-Medrol. 2. Hyponatremia  secondary to beer potomania.  Sodium levels are improving.  Continue IV fluid as ordered.  Urine sodium is low and TSH within normal limits.  We will continue to monitor with repeat labs in a.m. 3. AKI-improving.  Baseline creatinine previously noted to be 0.69.  This is likely prerenal in the setting of ongoing lisinopril use which has been discontinued.  There is also possibility of hepatorenal syndrome.  Continue to maintain hydration and recheck labs in a.m. monitor strict I's and O's. 4. History of alcohol abuse.  Patient is a high risk for withdrawal symptoms.  Maintain on CIWA precautions.  He was noted to be intoxicated on admission. 5. Chronic narcotic use with methadone.  Continue home methadone and confirm dosage with pharmacy. 6. Hypertension.  Blood pressure this morning is beginning to elevate.  We will hold lisinopril due to AKI.  Hydralazine will be added as needed.   DVT prophylaxis: SCDs Code Status: Full Family Communication: Father at bedside Disposition Plan: Continue IV normal saline and monitor on CIWA protocol.  GI consultation pending as well as further evaluation for suspected alcoholic hepatitis.   Consultants:   GI  Procedures:   None  Antimicrobials:   Rocephin 2/25->   Subjective: Patient seen and evaluated today with no new acute complaints or concerns. No acute concerns or events noted overnight.  He is complaining of some dry mouth and would like to have a diet started after abdominal ultrasound.  He denies any nausea or vomiting at this time.  No bowel movement noted for the last 2 days.  Objective: Vitals:  03/06/18 0200 03/06/18 0300 03/06/18 0400 03/06/18 0500  BP: 118/75 119/71 123/71 131/79  Pulse: 85 82 80 88  Resp: (!) 9 (!) 21 11 11   Temp:   98.9 F (37.2 C)   TempSrc:      SpO2: 93% 94% 94% 92%  Weight:      Height:        Intake/Output Summary (Last 24 hours) at 03/06/2018 0654 Last data filed at 03/06/2018 0400 Gross per 24 hour   Intake 577.15 ml  Output 225 ml  Net 352.15 ml   Filed Weights   03/05/18 1553 03/05/18 2300  Weight: 95.3 kg 95.3 kg    Examination:  General exam: Appears calm and comfortable, appears jaundiced Respiratory system: Clear to auscultation. Respiratory effort normal.  Nasal cannula Cardiovascular system: S1 & S2 heard, RRR. No JVD, murmurs, rubs, gallops or clicks. No pedal edema. Gastrointestinal system: Abdomen is distended, soft and nontender. No organomegaly or masses felt. Normal bowel sounds heard. Central nervous system: Alert and oriented. No focal neurological deficits. Extremities: Symmetric 5 x 5 power.  SCDs present Skin: No rashes, lesions or ulcers Psychiatry: Judgement and insight appear normal. Mood & affect appropriate.     Data Reviewed: I have personally reviewed following labs and imaging studies  CBC: Recent Labs  Lab 03/05/18 1610 03/06/18 0540  WBC 12.5* 9.1  HGB 11.5* 11.0*  HCT 31.1* 29.9*  MCV 96.3 97.7  PLT 107* 94*   Basic Metabolic Panel: Recent Labs  Lab 03/05/18 1610 03/05/18 1956 03/05/18 2350 03/06/18 0540  NA 115* 116* 117* 118*  K 4.7 4.8 4.9 5.1  CL 80* 80* 84* 85*  CO2 22 24 24 22   GLUCOSE 108* 98 97 134*  BUN 27* 27* 27* 28*  CREATININE 3.02* 3.11* 3.11* 2.78*  CALCIUM 8.5* 8.4* 8.1* 8.2*  MG 2.2  --   --   --   PHOS 5.6*  --   --   --    GFR: Estimated Creatinine Clearance: 43.2 mL/min (A) (by C-G formula based on SCr of 2.78 mg/dL (H)). Liver Function Tests: Recent Labs  Lab 03/05/18 1610 03/06/18 0540  AST 282* 246*  ALT 91* 91*  ALKPHOS 101 104  BILITOT 21.6*  22.6* 22.6*  PROT 7.1 6.8  ALBUMIN 2.3* 2.1*   No results for input(s): LIPASE, AMYLASE in the last 168 hours. No results for input(s): AMMONIA in the last 168 hours. Coagulation Profile: Recent Labs  Lab 03/05/18 2350  INR 1.6*   Cardiac Enzymes: No results for input(s): CKTOTAL, CKMB, CKMBINDEX, TROPONINI in the last 168 hours. BNP (last 3  results) No results for input(s): PROBNP in the last 8760 hours. HbA1C: No results for input(s): HGBA1C in the last 72 hours. CBG: No results for input(s): GLUCAP in the last 168 hours. Lipid Profile: No results for input(s): CHOL, HDL, LDLCALC, TRIG, CHOLHDL, LDLDIRECT in the last 72 hours. Thyroid Function Tests: Recent Labs    03/05/18 1610  TSH 1.771   Anemia Panel: No results for input(s): VITAMINB12, FOLATE, FERRITIN, TIBC, IRON, RETICCTPCT in the last 72 hours. Sepsis Labs: No results for input(s): PROCALCITON, LATICACIDVEN in the last 168 hours.  Recent Results (from the past 240 hour(s))  MRSA PCR Screening     Status: Abnormal   Collection Time: 03/05/18 10:43 PM  Result Value Ref Range Status   MRSA by PCR POSITIVE (A) NEGATIVE Final    Comment:        The GeneXpert MRSA Assay (FDA  approved for NASAL specimens only), is one component of a comprehensive MRSA colonization surveillance program. It is not intended to diagnose MRSA infection nor to guide or monitor treatment for MRSA infections. RESULT CALLED TO, READ BACK BY AND VERIFIED WITH: R WAGONER,RN @0426  03/06/18 Performed at [facility], [address] [place], [place] [id number]          Radiology Studies: No results found.      Scheduled Meds: . Chlorhexidine Gluconate Cloth  6 each Topical Q0600  . citalopram  40 mg Oral Daily  . folic acid  1 mg Oral Daily  . LORazepam  0-4 mg Intravenous Q6H   Followed by  . [START ON 03/08/2018] LORazepam  0-4 mg Intravenous Q12H  . methadone  50 mg Oral q morning - 10a  . methylPREDNISolone (SOLU-MEDROL) injection  40 mg Intravenous Daily  . multivitamin with minerals  1 tablet Oral Daily  . mupirocin ointment  1 application Nasal BID  . pantoprazole (PROTONIX) IV  40 mg Intravenous Q12H  . thiamine  100 mg Oral Daily   Or  . thiamine  100 mg Intravenous Daily   Continuous Infusions: . cefTRIAXone (ROCEPHIN)  IV Stopped (03/06/18 0031)  .  dextrose 5 % and 0.9% NaCl 125 mL/hr at 03/06/18 0300     LOS: 1 day    Time spent: 30 minutes    Sue Fernicola Hoover BrunetteD Rily Nickey, DO [facility] Pager [phone]  If 7PM-7AM, please contact night-coverage [email or web address] Password [email or web address] 03/06/2018, 6:54 AM

## 2018-03-06 NOTE — Consult Note (Signed)
Reason for Consult: Liver failure Referring Physician:    Griffin Tarry is an 38 y.o. male.  HPI: Admitted during the night. Has been sick for about 3 months. Worse in last 1 months. C/o skin being yellow, no appetite, weakness, abdominal distention and diffuse abdominal pain, ? fever off and on. Has been having black stools for the past month and also has vomited blood mixed with mucous.  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 Hx of etoh abuse since age 68. Admitted to drinking 10-11 beers a day (16 oz) and sometimes more on the weekend. Has not been able to drink as much over the past week. Denies prior hx of etoh withdrawal.  Has decreased appetite but no weight loss.   Divorced. Unemployed. One child.   Past Medical History:  Diagnosis Date  . Alcohol abuse   . Hypertension   . Liver failure (HCC)     History reviewed. No pertinent surgical history.  No family history on file.  Social History:  reports that he has been smoking cigarettes. He has been smoking about 1.50 packs per day. He has never used smokeless tobacco. He reports current alcohol use. He reports previous drug use.  Allergies: No Known Allergies  Medications: I have reviewed the patient's current medications.  Results for orders placed or performed during the hospital encounter of 03/05/18 (from the past 48 hour(s))  Urine rapid drug screen (hosp performed)     Status: Abnormal   Collection Time: 03/05/18  4:40 AM  Result Value Ref Range   Opiates NONE DETECTED NONE DETECTED   Cocaine NONE DETECTED NONE DETECTED   Benzodiazepines POSITIVE (A) NONE DETECTED   Amphetamines NONE DETECTED NONE DETECTED   Tetrahydrocannabinol NONE DETECTED NONE DETECTED   Barbiturates NONE DETECTED NONE DETECTED    Comment: (NOTE) DRUG SCREEN FOR MEDICAL PURPOSES ONLY.  IF CONFIRMATION IS NEEDED FOR ANY PURPOSE, NOTIFY LAB WITHIN 5 DAYS. LOWEST DETECTABLE LIMITS FOR URINE DRUG SCREEN Drug Class                      Cutoff (ng/mL) Amphetamine and metabolites    1000 Barbiturate and metabolites    200 Benzodiazepine                 200 Tricyclics and metabolites     300 Opiates and metabolites        300 Cocaine and metabolites        300 THC                            50 Performed at [facility], [address], [place], [place] [address]   Comprehensive metabolic panel     Status: Abnormal   Collection Time: 03/05/18  4:10 PM  Result Value Ref Range   Sodium 115 (LL) 135 - 145 mmol/L    Comment: CRITICAL RESULT CALLED TO, READ BACK BY AND VERIFIED WITH: MINTER,R ON 03/05/18 AT 1705 BY LOY,C    Potassium 4.7 3.5 - 5.1 mmol/L   Chloride 80 (L) 98 - 111 mmol/L   CO2 22 22 - 32 mmol/L   Glucose, Bld 108 (H) 70 - 99 mg/dL   BUN 27 (H) 6 - 20 mg/dL   Creatinine, Ser 5.88 (H) 0.61 - 1.24 mg/dL    Comment: ICTERUS AT THIS LEVEL MAY AFFECT RESULT   Calcium 8.5 (L) 8.9 - 10.3 mg/dL   Total Protein 7.1  6.5 - 8.1 g/dL   Albumin 2.3 (L) 3.5 - 5.0 g/dL   AST 062 (H) 15 - 41 U/L   ALT 91 (H) 0 - 44 U/L    Comment: ICTERUS AT THIS LEVEL MAY AFFECT RESULT   Alkaline Phosphatase 101 38 - 126 U/L   Total Bilirubin 22.6 (HH) 0.3 - 1.2 mg/dL    Comment: CRITICAL RESULT CALLED TO, READ BACK BY AND VERIFIED WITH: MINTER,R ON 03/05/18 AT 1705 BT LOY,C    GFR calc non Af Amer 25 (L) >60 mL/min   GFR calc Af Amer 29 (L) >60 mL/min   Anion gap 13 5 - 15    Comment: Performed at [facility], [address], [place], [place] [address]  CBC     Status: Abnormal   Collection Time: 03/05/18  4:10 PM  Result Value Ref Range   WBC 12.5 (H) 4.0 - 10.5 K/uL    Comment: WHITE COUNT CONFIRMED ON SMEAR   RBC 3.23 (L) 4.22 - 5.81 MIL/uL   Hemoglobin 11.5 (L) 13.0 - 17.0 g/dL   HCT 46.2 (L) 70.3 - 50.0 %   MCV 96.3 80.0 - 100.0 fL   MCH 35.6 (H) 26.0 - 34.0 pg   MCHC 37.0 (H) 30.0 - 36.0 g/dL   RDW 93.8 (H) 18.2 - 99.3 %   Platelets 107 (L) 150 - 400 K/uL    Comment: PLATELET COUNT CONFIRMED BY SMEAR SPECIMEN  CHECKED FOR CLOTS Immature Platelet Fraction may be clinically indicated, consider ordering this additional test [id number]    nRBC 0.0 0.0 - 0.2 %    Comment: Performed at [facility], [address] [place], [place] [address]  Type and screen [facility]     Status: None   Collection Time: 03/05/18  4:10 PM  Result Value Ref Range   ABO/RH(D) A POS    Antibody Screen NEG    Sample Expiration      03/08/2018 Performed at [facility], [address], [place], [place] [address]   TSH     Status: None   Collection Time: 03/05/18  4:10 PM  Result Value Ref Range   TSH 1.771 0.350 - 4.500 uIU/mL    Comment: Performed by a 3rd Generation assay with a functional sensitivity of <=0.01 uIU/mL. Performed at [facility], [address], [place], [place] [address]   Magnesium     Status: None   Collection Time: 03/05/18  4:10 PM  Result Value Ref Range   Magnesium 2.2 1.7 - 2.4 mg/dL    Comment: Performed at [facility], [address] [place], [place] [address]  Phosphorus     Status: Abnormal   Collection Time: 03/05/18  4:10 PM  Result Value Ref Range   Phosphorus 5.6 (H) 2.5 - 4.6 mg/dL    Comment: ICTERUS AT THIS LEVEL MAY AFFECT RESULT Performed at [facility], [address], [place], [place] [address]   Bilirubin, fractionated(tot/dir/indir)     Status: Abnormal   Collection Time: 03/05/18  4:10 PM  Result Value Ref Range   Total Bilirubin 21.6 (HH) 0.3 - 1.2 mg/dL    Comment: CRITICAL VALUE NOTED.  VALUE IS CONSISTENT WITH PREVIOUSLY REPORTED AND CALLED VALUE.   Bilirubin, Direct 13.6 (H) 0.0 - 0.2 mg/dL    Comment: RESULTS CONFIRMED BY MANUAL DILUTION   Indirect Bilirubin 8.0 (H) 0.3 - 0.9 mg/dL    Comment: Performed at [facility], [address] [place], [place] [address]  Ethanol  Status: Abnormal   Collection Time: 03/05/18  4:10 PM  Result Value Ref Range   Alcohol, Ethyl (B) 22 (H) <10 mg/dL    Comment: (NOTE) Lowest detectable limit for serum  alcohol is 10 mg/dL. For medical purposes only. Performed at [facility], [address] [place], [place] [address]   Basic metabolic panel     Status: Abnormal   Collection Time: 03/05/18  7:56 PM  Result Value Ref Range   Sodium 116 (LL) 135 - 145 mmol/L    Comment: CRITICAL RESULT CALLED TO, READ BACK BY AND VERIFIED WITH: DOSS,M ON 03/05/18 AT 2100 BY LOY,C    Potassium 4.8 3.5 - 5.1 mmol/L   Chloride 80 (L) 98 - 111 mmol/L   CO2 24 22 - 32 mmol/L   Glucose, Bld 98 70 - 99 mg/dL   BUN 27 (H) 6 - 20 mg/dL   Creatinine, Ser 6.04 (H) 0.61 - 1.24 mg/dL    Comment: ICTERUS AT THIS LEVEL MAY AFFECT RESULT   Calcium 8.4 (L) 8.9 - 10.3 mg/dL   GFR calc non Af Amer 24 (L) >60 mL/min   GFR calc Af Amer 28 (L) >60 mL/min   Anion gap 12 5 - 15    Comment: Performed at [facility], [address], [place], [place] [address]  MRSA PCR Screening     Status: Abnormal   Collection Time: 03/05/18 10:43 PM  Result Value Ref Range   MRSA by PCR POSITIVE (A) NEGATIVE    Comment:        The GeneXpert MRSA Assay (FDA approved for NASAL specimens only), is one component of a comprehensive MRSA colonization surveillance program. It is not intended to diagnose MRSA infection nor to guide or monitor treatment for MRSA infections. RESULT CALLED TO, READ BACK BY AND VERIFIED WITH: R WAGONER,RN  03/06/18 Performed at [facility], [address] [place], [place] [address]   Basic metabolic panel     Status: Abnormal   Collection Time: 03/05/18 11:50 PM  Result Value Ref Range   Sodium 117 (LL) 135 - 145 mmol/L    Comment: CRITICAL RESULT CALLED TO, READ BACK BY AND VERIFIED WITH: H TETREAULT,RN  03/06/18  MKELLY    Potassium 4.9 3.5 - 5.1 mmol/L   Chloride 84 (L) 98 - 111 mmol/L   CO2 24 22 - 32 mmol/L   Glucose, Bld 97 70 - 99 mg/dL   BUN 27 (H) 6 - 20 mg/dL   Creatinine, Ser 7.82 (H) 0.61 - 1.24 mg/dL   Calcium 8.1 (L) 8.9 - 10.3 mg/dL   GFR calc non Af Amer 24 (L) >60 mL/min    GFR calc Af Amer 28 (L) >60 mL/min   Anion gap 9 5 - 15    Comment: Performed at [facility], [address] [place], [place] [address]  Protime-INR     Status: Abnormal   Collection Time: 03/05/18 11:50 PM  Result Value Ref Range   Prothrombin Time 18.8 (H) 11.4 - 15.2 seconds   INR 1.6 (H) 0.8 - 1.2    Comment: (NOTE) INR goal varies based on device and disease states. Performed at [facility], [address], [place], [place] [address]   Sodium, urine, random     Status: None   Collection Time: 03/06/18  4:40 AM  Result Value Ref Range   Sodium, Ur <10 mmol/L    Comment: Performed at [facility], [address], [place], [place] [address]  Basic metabolic panel  Status: Abnormal   Collection Time: 03/06/18  5:40 AM  Result Value Ref Range   Sodium 118 (LL) 135 - 145 mmol/L    Comment: CRITICAL RESULT CALLED TO, READ BACK BY AND VERIFIED WITH: WAGONER,R AT 6:30AM ON 03/06/18 BY FESTERMAN,C    Potassium 5.1 3.5 - 5.1 mmol/L   Chloride 85 (L) 98 - 111 mmol/L   CO2 22 22 - 32 mmol/L   Glucose, Bld 134 (H) 70 - 99 mg/dL   BUN 28 (H) 6 - 20 mg/dL   Creatinine, Ser 1.61 (H) 0.61 - 1.24 mg/dL   Calcium 8.2 (L) 8.9 - 10.3 mg/dL   GFR calc non Af Amer 28 (L) >60 mL/min   GFR calc Af Amer 32 (L) >60 mL/min   Anion gap 11 5 - 15    Comment: Performed at [facility], [address], [place], [place] [address]  CBC     Status: Abnormal   Collection Time: 03/06/18  5:40 AM  Result Value Ref Range   WBC 9.1 4.0 - 10.5 K/uL   RBC 3.06 (L) 4.22 - 5.81 MIL/uL   Hemoglobin 11.0 (L) 13.0 - 17.0 g/dL   HCT 54.0 (L) 98.1 - 19.1 %   MCV 97.7 80.0 - 100.0 fL   MCH 35.9 (H) 26.0 - 34.0 pg   MCHC 36.8 (H) 30.0 - 36.0 g/dL   RDW 47.8 (H) 29.5 - 62.1 %   Platelets 94 (L) 150 - 400 K/uL    Comment: SPECIMEN CHECKED FOR CLOTS Immature Platelet Fraction may be clinically indicated, consider ordering this additional test [id number] CONSISTENT WITH PREVIOUS RESULT    nRBC 0.0 0.0 - 0.2 %     Comment: Performed at [facility], [address], [place], [place] [address]  Hepatic function panel     Status: Abnormal   Collection Time: 03/06/18  5:40 AM  Result Value Ref Range   Total Protein 6.8 6.5 - 8.1 g/dL   Albumin 2.1 (L) 3.5 - 5.0 g/dL   AST 528 (H) 15 - 41 U/L   ALT 91 (H) 0 - 44 U/L   Alkaline Phosphatase 104 38 - 126 U/L   Total Bilirubin 22.6 (HH) 0.3 - 1.2 mg/dL    Comment: CRITICAL RESULT CALLED TO, READ BACK BY AND VERIFIED WITH: WAGONER,R AT 6:30AM ON 03/06/18 BY FESTERMAN,C    Bilirubin, Direct 14.8 (H) 0.0 - 0.2 mg/dL    Comment: RESULTS CONFIRMED BY MANUAL DILUTION   Indirect Bilirubin 7.8 (H) 0.3 - 0.9 mg/dL    Comment: Performed at [facility], [address] [place], [place] [address]    No results found.  ROS Blood pressure 128/85, pulse 88, temperature 97.8 F (36.6 C), temperature source Oral, resp. rate 13, height 6' (1.829 m), weight 95.3 kg, SpO2 94 %. Physical Exam Father present in room. Alert and oriented. Skin warm and dry. Oral mucosa is moist.   . Sclera icteric, conjunctivae is pink. Thyroid not enlarged. No cervical lymphadenopathy. Lungs clear. Heart regular rate and rhythm.  Abdomen is feels tense and is distended.  Bowel sounds are positive but very faint. .   No abdominal  tenderness.  No edema to lower extremities.  Asterixis present (slight)     Assessment/Plan: Alcoholic liver failure. US abdomen is pending. Dr. Karilyn Cota is aware and has placed orders. Acute hepatitis panel is pending. Condition is very guarded.  Meld score 52.6.  Charles Hebert 03/06/2018, 8:25 AM

## 2018-03-07 ENCOUNTER — Inpatient Hospital Stay (HOSPITAL_COMMUNITY): Payer: Medicaid Other

## 2018-03-07 ENCOUNTER — Encounter (HOSPITAL_COMMUNITY): Payer: Self-pay | Admitting: *Deleted

## 2018-03-07 LAB — COMPREHENSIVE METABOLIC PANEL
ALT: 80 U/L — ABNORMAL HIGH (ref 0–44)
AST: 187 U/L — ABNORMAL HIGH (ref 15–41)
Albumin: 2 g/dL — ABNORMAL LOW (ref 3.5–5.0)
Alkaline Phosphatase: 93 U/L (ref 38–126)
Anion gap: 7 (ref 5–15)
BUN: 26 mg/dL — ABNORMAL HIGH (ref 6–20)
CO2: 22 mmol/L (ref 22–32)
Calcium: 8.1 mg/dL — ABNORMAL LOW (ref 8.9–10.3)
Chloride: 95 mmol/L — ABNORMAL LOW (ref 98–111)
Creatinine, Ser: 1.92 mg/dL — ABNORMAL HIGH (ref 0.61–1.24)
GFR calc Af Amer: 50 mL/min — ABNORMAL LOW (ref >60)
GFR calc non Af Amer: 43 mL/min — ABNORMAL LOW (ref >60)
Glucose, Bld: 146 mg/dL — ABNORMAL HIGH (ref 70–99)
Potassium: 5.2 mmol/L — ABNORMAL HIGH (ref 3.5–5.1)
Sodium: 124 mmol/L — ABNORMAL LOW (ref 135–145)
TOTAL PROTEIN: 6.5 g/dL (ref 6.5–8.1)
Total Bilirubin: 19.7 mg/dL (ref 0.3–1.2)

## 2018-03-07 LAB — HEPATITIS PANEL, ACUTE
HCV Ab: >11 s/co ratio — ABNORMAL HIGH (ref 0.0–0.9)
Hep A IgM: NEGATIVE
Hep B C IgM: NEGATIVE
Hepatitis B Surface Ag: NEGATIVE

## 2018-03-07 LAB — HIV ANTIBODY (ROUTINE TESTING W REFLEX): HIV Screen 4th Generation wRfx: NONREACTIVE

## 2018-03-07 LAB — CBC
HCT: 27.7 % — ABNORMAL LOW (ref 39.0–52.0)
Hemoglobin: 10 g/dL — ABNORMAL LOW (ref 13.0–17.0)
MCH: 35.7 pg — ABNORMAL HIGH (ref 26.0–34.0)
MCHC: 36.1 g/dL — ABNORMAL HIGH (ref 30.0–36.0)
MCV: 98.9 fL (ref 80.0–100.0)
Platelets: 99 10*3/uL — ABNORMAL LOW (ref 150–400)
RBC: 2.8 MIL/uL — ABNORMAL LOW (ref 4.22–5.81)
RDW: 16.5 % — ABNORMAL HIGH (ref 11.5–15.5)
WBC: 13.9 10*3/uL — ABNORMAL HIGH (ref 4.0–10.5)
nRBC: 0 % (ref 0.0–0.2)

## 2018-03-07 LAB — PROTIME-INR
INR: 1.8 — ABNORMAL HIGH (ref 0.8–1.2)
Prothrombin Time: 20.2 seconds — ABNORMAL HIGH (ref 11.4–15.2)

## 2018-03-07 MED ORDER — SODIUM CHLORIDE 0.9 % IV SOLN
INTRAVENOUS | Status: DC
  Administered 2018-03-07 – 2018-03-09 (×3): via INTRAVENOUS

## 2018-03-07 MED ORDER — ALBUMIN HUMAN 25 % IV SOLN
50.0000 g | Freq: Once | INTRAVENOUS | Status: AC
  Administered 2018-03-07: 50 g via INTRAVENOUS
  Filled 2018-03-07: qty 200

## 2018-03-07 NOTE — Progress Notes (Signed)
Ultrasound report noted. Ascites minimal therefore paracentesis not performed. Patient's condition discussed with both parents were at bedside. Will advance diet to 4 g sodium diet and decrease IV fluid rate to 50 mL/h.

## 2018-03-07 NOTE — Progress Notes (Signed)
Patient ID: Charles Hebert, male   DOB: 07-09-1980, 38 y.o.   MRN: [id number]   Limited US Abd performed   Very small amt of ascites noted at liver area No safe window to access this pocket  Consider recheck in a few days  NO paracentesis performed

## 2018-03-07 NOTE — Progress Notes (Signed)
PROGRESS NOTE    Charles Hebert  DIY:[id number] DOB: 08/24/1980 DOA: 03/05/2018 PCP: Smith Robert, MD   Brief Narrative:  Per HPI: Charles Hebert a 38 y.o.malewith medical history significant foralcohol abuse,HTN,was sent to the ED by his primary care provider after blood work showed low sodium.Patient reportsnoticing skin color change about 2 weeks ago,reports vomiting blood last episode this morning 3 times, black stools and loosestoolsover the past month.Averages 3-4 stools daily. Reports poor p.o. intake and fatigue over the past month. Reports gradualabdominal swellingwithtightness over the past 3 months,without leg swelling, no difficulty breathing. He reports abdominal pain right upper quadrant and epigastric area. No chest pain, no difficulty breathing. No seizures, no confusion. Patient lives with his father who is present at bedside.  Patient admits to drinking at least 10 beers every day.He tells me his last drink was yesterday- 1 beer,because he is trying to cut down. He started drinking at the age of 46. He drinks to avoid going into withdrawal. Has been on methadone since 2015,goes to a pain clinic in [place],[facility]. DeniesIV drug use.  Patient has been started on treatment for beer potomania with IV normal saline with improvement and sodium noted.  AKI is also improving.  GI consultation pending for alcoholic hepatitis/liver failure.  Hepatitis panel with positive HCV antibody noted in right upper quadrant ultrasound with noted cirrhosis and ascites along with possible mass.   Assessment & Plan:   Active Problems:   Hyponatremia  1. Hepatitis likely multifactorial with alcohol use as well as positive HCV antibody with liver injury/failure.  Patient has a poor prognosis with meld score of 36. Bilirubin continues to remain elevated as well as liver enzymes, but they are downtrending.  Appreciate GI evaluation with  plans for paracentesis today as well as further evaluation of hepatitis.  Continue on IV Protonix.  Continue on Rocephin for SBP prophylaxis.  Continue on Solu-Medrol.  INR continues to remain elevated despite vitamin K administration. 2. 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  Sodium levels are improving steadily.  Continue IV fluid, but chest with normal saline at this time and discontinue dextrose.  Urine sodium is low and TSH within normal limits.  We will continue to monitor with repeat labs in a.m. 3. AKI-improving.  Baseline creatinine previously noted to be 0.69.  This is likely prerenal in the setting of ongoing lisinopril use which has been discontinued.  There is also possibility of hepatorenal syndrome.  Continue to maintain hydration and recheck labs in a.m. monitor strict I's and O's. 4. History of alcohol abuse.  Patient is a high risk for withdrawal symptoms.  Maintain on CIWA precautions.  He was noted to be intoxicated on admission. 5. Chronic narcotic use with methadone.  Continue home methadone and confirm dosage with pharmacy. 6. Hypertension.  Blood pressure this morning is improved.  We will hold lisinopril due to AKI.  Hydralazine to continue as needed.   DVT prophylaxis: SCDs Code Status: Full Family Communication: Father at bedside Disposition Plan: Continue IV normal saline and monitor on CIWA protocol.  GI consultation appreciated with further plans to evaluate for hepatitis and plans for paracentesis today.  Maintain in stepdown unit and potentially transfer to floor by a.m.   Consultants:   GI  Procedures:   None  Antimicrobials:   Rocephin 2/25->  Subjective: Patient seen and evaluated today with no new acute complaints or concerns. No acute concerns or events noted overnight.  He is tolerating his diet and denies any  significant complaints or concerns.  Objective: Vitals:   03/07/18 0300 03/07/18 0400 03/07/18 0500 03/07/18 0600  BP: 112/66  114/64 94/79 111/70  Pulse: 94 88 96 95  Resp: 14 (!) Temp:  97.9 F (36.6 C)    TempSrc:  Oral    SpO2: 91% 91% 94% 92%  Weight:   96.9 kg   Height:        Intake/Output Summary (Last 24 hours) at 03/07/2018 0758 Last data filed at 03/07/2018 0500 Gross per 24 hour  Intake 3252.97 ml  Output 2850 ml  Net 402.97 ml   Filed Weights   03/05/18 1553 03/05/18 2300 03/07/18 0500  Weight: 95.3 kg 95.3 kg 96.9 kg    Examination:  General exam: Appears calm and comfortable, patient is jaundiced Respiratory system: Clear to auscultation. Respiratory effort normal.  Currently on room air. Cardiovascular system: S1 & S2 heard, RRR. No JVD, murmurs, rubs, gallops or clicks. No pedal edema. Gastrointestinal system: Abdomen is distended, soft and nontender. No organomegaly or masses felt. Normal bowel sounds heard. Central nervous system: Alert and oriented. No focal neurological deficits. Extremities: Symmetric 5 x 5 power. Skin: No rashes, lesions or ulcers Psychiatry: Judgement and insight appear normal. Mood & affect appropriate.     Data Reviewed: I have personally reviewed following labs and imaging studies  CBC: Recent Labs  Lab 03/05/18 1610 03/06/18 0540 03/07/18 0259  WBC 12.5* 9.1 13.9*  HGB 11.5* 11.0* 10.0*  HCT 31.1* 29.9* 27.7*  MCV 96.3 97.7 98.9  PLT 107* 94* 99*   Basic Metabolic Panel: Recent Labs  Lab 03/05/18 1610  03/05/18 2350 03/06/18 0540 03/06/18 1235 03/06/18 1901 03/07/18 0259  NA 115*   < > 117* 118* 119* 120* 124*  K 4.7   < > 4.9 5.1 4.8 4.8 5.2*  CL 80*   < > 84* 85* 89* 91* 95*  CO2 22   < > 24 22 21* 20* 22  GLUCOSE 108*   < > 97 134* 161* 154* 146*  BUN 27*   < > 27* 28* 26* 27* 26*  CREATININE 3.02*   < > 3.11* 2.78* 2.38* 2.07* 1.92*  CALCIUM 8.5*   < > 8.1* 8.2* 8.1* 8.1* 8.1*  MG 2.2  --   --   --   --   --   --   PHOS 5.6*  --   --   --   --   --   --    < > = values in this interval not displayed.    GFR: Estimated Creatinine Clearance: 62.9 mL/min (A) (by C-G formula based on SCr of 1.92 mg/dL (H)). Liver Function Tests: Recent Labs  Lab 03/05/18 1610 03/06/18 0540 03/07/18 0259  AST 282* 246* 187*  ALT 91* 91* 80*  ALKPHOS 101 104 93  BILITOT 21.6*  22.6* 22.6* 19.7*  PROT 7.1 6.8 6.5  ALBUMIN 2.3* 2.1* 2.0*   No results for input(s): LIPASE, AMYLASE in the last 168 hours. No results for input(s): AMMONIA in the last 168 hours. Coagulation Profile: Recent Labs  Lab 03/05/18 2350 03/07/18 0259  INR 1.6* 1.8*   Cardiac Enzymes: No results for input(s): CKTOTAL, CKMB, CKMBINDEX, TROPONINI in the last 168 hours. BNP (last 3 results) No results for input(s): PROBNP in the last 8760 hours. HbA1C: No results for input(s): HGBA1C in the last 72 hours. CBG: No results for input(s): GLUCAP in the last 168 hours. Lipid Profile: No results for  input(s): CHOL, HDL, LDLCALC, TRIG, CHOLHDL, LDLDIRECT in the last 72 hours. Thyroid Function Tests: Recent Labs    03/05/18 1610  TSH 1.771   Anemia Panel: No results for input(s): VITAMINB12, FOLATE, FERRITIN, TIBC, IRON, RETICCTPCT in the last 72 hours. Sepsis Labs: No results for input(s): PROCALCITON, LATICACIDVEN in the last 168 hours.  Recent Results (from the past 240 hour(s))  MRSA PCR Screening     Status: Abnormal   Collection Time: 03/05/18 10:43 PM  Result Value Ref Range Status   MRSA by PCR POSITIVE (A) NEGATIVE Final    Comment:        The GeneXpert MRSA Assay (FDA approved for NASAL specimens only), is one component of a comprehensive MRSA colonization surveillance program. It is not intended to diagnose MRSA infection nor to guide or monitor treatment for MRSA infections. RESULT CALLED TO, READ BACK BY AND VERIFIED WITH: R WAGONER,RN @0426  03/06/18 Performed at [facility], [address], [place], [place] [address]          Radiology Studies: US Abdomen Limited Ruq  Result Date:  03/06/2018 CLINICAL DATA:  Hyperbilirubinemia. History of liver failure, hypertension and alcohol abuse. EXAM: ULTRASOUND ABDOMEN LIMITED RIGHT UPPER QUADRANT COMPARISON:  None. FINDINGS: Gallbladder: Small amount of layering echogenic sludge in the gallbladder but no gallstones, wall thickening, pericholecystic fluid or sonographic Murphy sign to suggest acute cholecystitis. Common bile duct: Diameter: 4.7 mm Liver: Cirrhotic changes involving the liver with a very irregular liver contour, increased coarse echogenicity and associated ascites. Vague area of slight increased echogenicity noted near the portal bifurcation. This could be fat but a hepatoma would also be a possibility. Reversed flow in the portal vein. IMPRESSION: 1. 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 There is also ascites. 2. Vague 3 cm echogenic area noted near the portal vein bifurcation. This is indeterminate and may reflect focal fat but could not exclude hepatoma. MRI abdomen without and with contrast may be helpful for further evaluation. 3. Echogenic sludge in the gallbladder but no definite gallstones or acute cholecystitis. Normal caliber common bile duct. Electronically Signed   By: Rudie Meyer M.D.   On: 03/06/2018 10:29        Scheduled Meds: . Chlorhexidine Gluconate Cloth  6 each Topical Q0600  . citalopram  20 mg Oral Daily  . folic acid  1 mg Oral Daily  . LORazepam  0-4 mg Intravenous Q6H   Followed by  . [START ON 03/08/2018] LORazepam  0-4 mg Intravenous Q12H  . methadone  50 mg Oral Daily  . methylPREDNISolone (SOLU-MEDROL) injection  40 mg Intravenous Daily  . multivitamin with minerals  1 tablet Oral Daily  . mupirocin ointment  1 application Nasal BID  . pantoprazole (PROTONIX) IV  40 mg Intravenous Q12H  . thiamine  100 mg Oral Daily   Or  . thiamine  100 mg Intravenous Daily   Continuous Infusions: . cefTRIAXone (ROCEPHIN)  IV  Stopped (03/06/18 2300)  . dextrose 5 % and 0.9% NaCl 125 mL/hr at 03/07/18 0500     LOS: 2 days    Time spent: 30 minutes    Dax Murguia Hoover Brunette, DO [facility] Pager [phone]  If 7PM-7AM, please contact night-coverage [email or web address] Password [facility] 03/07/2018, 7:58 AM

## 2018-03-07 NOTE — Progress Notes (Signed)
Subjective:  Patient has no complaints.  He states he slept well last night.  He denies nausea vomiting abdominal pain or shortness of breath.  He states he has not used IV drugs in 5 years.  He states he used opiates.  Objective: Blood pressure 111/70, pulse 95, temperature 97.9 F (36.6 C), temperature source Oral, resp. rate 18, height 6' (1.829 m), weight 96.9 kg, SpO2 92 %. Patient is alert. He is tremulous but not have asterixis. Conjunctiva is pink. Sclera is icteric Oropharyngeal mucosa is normal. No neck masses or thyromegaly noted. Cardiac exam with regular rhythm normal S1 and S2. No murmur or gallop noted. Lungs are clear to auscultation. Abdomen is distended and somewhat tense.  No tenderness noted.  Difficult to palpate liver or spleen in setting of ascites. No LE edema or clubbing noted.  Labs/studies Results:  CBC Latest Ref Rng & Units 03/07/2018 03/06/2018 03/05/2018  WBC 4.0 - 10.5 K/uL 13.9(H) 9.1 12.5(H)  Hemoglobin 13.0 - 17.0 g/dL 10.0(L) 11.0(L) 11.5(L)  Hematocrit 39.0 - 52.0 % 27.7(L) 29.9(L) 31.1(L)  Platelets 150 - 400 K/uL 99(L) 94(L) 107(L)    CMP Latest Ref Rng & Units 03/07/2018 03/06/2018 03/06/2018  Glucose 70 - 99 mg/dL 146(H) 154(H) 161(H)  BUN 6 - 20 mg/dL 26(H) 27(H) 26(H)  Creatinine 0.61 - 1.24 mg/dL 1.92(H) 2.07(H) 2.38(H)  Sodium 135 - 145 mmol/L 124(L) 120(L) 119(LL)  Potassium 3.5 - 5.1 mmol/L 5.2(H) 4.8 4.8  Chloride 98 - 111 mmol/L 95(L) 91(L) 89(L)  CO2 22 - 32 mmol/L 22 20(L) 21(L)  Calcium 8.9 - 10.3 mg/dL 8.1(L) 8.1(L) 8.1(L)  Total Protein 6.5 - 8.1 g/dL 6.5 - -  Total Bilirubin 0.3 - 1.2 mg/dL 19.7(HH) - -  Alkaline Phos 38 - 126 U/L 93 - -  AST 15 - 41 U/L 187(H) - -  ALT 0 - 44 U/L 80(H) - -    Hepatic Function Latest Ref Rng & Units 03/07/2018 03/06/2018 03/05/2018  Total Protein 6.5 - 8.1 g/dL 6.5 6.8 -  Albumin 3.5 - 5.0 g/dL 2.0(L) 2.1(L) -  AST 15 - 41 U/L 187(H) 246(H) -  ALT 0 - 44 U/L 80(H) 91(H) -  Alk Phosphatase 38  - 126 U/L 93 104 -  Total Bilirubin 0.3 - 1.2 mg/dL 19.7(HH) 22.6(HH) 21.6(HH)  Bilirubin, Direct 0.0 - 0.2 mg/dL - 14.8(H) 13.6(H)    No results found for: CRP    Assessment:  #1.  Decompensated liver disease.  Etiology is felt to be predominantly alcohol induced injury.  Hepatitis C antibody is positive.  Need to do follow-up testing to find out if he has active infection.  When severity of his injury I believe we should continue with methylprednisolone.  Her disease is complicated by ascites coagulopathy acute knee injury thrombocytopenia as well as hyponatremia. Overall he appears to be improving except INR is up.  He has long way to go.  #2.  AKI.  Renal injury most likely due to alcoholic liver disease.  He may also have an element of dehydration on presentation.  Renal function has improved significantly in the last 48 hours.  #3.  Hyponatremia.  Hyponatremia is gradually correcting.  Hyponatremia in the setting of liver disease is bad prognostic indicator.  In his situation it may be multifactorial.  #4.  Ascites.  Abdominal tap for diagnostic and therapeutic purposes indicated.   Plan:  Diagnostic and therapeutic abdominal paracentesis. HCV RNA by PCR quantitative. Advance diet once paracentesis accomplished. Fluid to be sent  for routine studies including AFB smear and culture and cytology.  Patient's condition discussed with Dr. Manuella Ghazi.

## 2018-03-08 LAB — COMPREHENSIVE METABOLIC PANEL
ALK PHOS: 89 U/L (ref 38–126)
ALT: 72 U/L — ABNORMAL HIGH (ref 0–44)
AST: 141 U/L — ABNORMAL HIGH (ref 15–41)
Albumin: 2.6 g/dL — ABNORMAL LOW (ref 3.5–5.0)
Anion gap: 7 (ref 5–15)
BUN: 22 mg/dL — ABNORMAL HIGH (ref 6–20)
CALCIUM: 8.6 mg/dL — AB (ref 8.9–10.3)
CO2: 24 mmol/L (ref 22–32)
CREATININE: 1.29 mg/dL — AB (ref 0.61–1.24)
Chloride: 100 mmol/L (ref 98–111)
GFR calc non Af Amer: >60 mL/min (ref >60)
Glucose, Bld: 136 mg/dL — ABNORMAL HIGH (ref 70–99)
Potassium: 5.1 mmol/L (ref 3.5–5.1)
Sodium: 131 mmol/L — ABNORMAL LOW (ref 135–145)
TOTAL PROTEIN: 6.8 g/dL (ref 6.5–8.1)
Total Bilirubin: 18.6 mg/dL (ref 0.3–1.2)

## 2018-03-08 LAB — CBC
HCT: 26.7 % — ABNORMAL LOW (ref 39.0–52.0)
Hemoglobin: 9.4 g/dL — ABNORMAL LOW (ref 13.0–17.0)
MCH: 36.7 pg — AB (ref 26.0–34.0)
MCHC: 35.2 g/dL (ref 30.0–36.0)
MCV: 104.3 fL — ABNORMAL HIGH (ref 80.0–100.0)
Platelets: 94 10*3/uL — ABNORMAL LOW (ref 150–400)
RBC: 2.56 MIL/uL — ABNORMAL LOW (ref 4.22–5.81)
RDW: 17.8 % — ABNORMAL HIGH (ref 11.5–15.5)
WBC: 14.3 10*3/uL — ABNORMAL HIGH (ref 4.0–10.5)
nRBC: 0 % (ref 0.0–0.2)

## 2018-03-08 LAB — HCV RNA QUANT RFLX ULTRA OR GENOTYP
HCV RNA Qnt(log copy/mL): UNDETERMINED log10 IU/mL
HEPATITIS C QUANTITATION: NOT DETECTED [IU]/mL

## 2018-03-08 LAB — PROTIME-INR
INR: 1.5 — ABNORMAL HIGH (ref 0.8–1.2)
Prothrombin Time: 18.4 seconds — ABNORMAL HIGH (ref 11.4–15.2)

## 2018-03-08 MED ORDER — PREDNISOLONE 5 MG PO TABS
40.0000 mg | ORAL_TABLET | Freq: Every day | ORAL | Status: DC
  Administered 2018-03-09: 40 mg via ORAL
  Filled 2018-03-08 (×3): qty 8

## 2018-03-08 MED ORDER — PANTOPRAZOLE SODIUM 40 MG PO TBEC
40.0000 mg | DELAYED_RELEASE_TABLET | Freq: Two times a day (BID) | ORAL | Status: DC
  Administered 2018-03-08 – 2018-03-10 (×5): 40 mg via ORAL
  Filled 2018-03-08 (×5): qty 1

## 2018-03-08 MED ORDER — POLYETHYLENE GLYCOL 3350 17 G PO PACK
17.0000 g | PACK | Freq: Every day | ORAL | Status: DC
  Administered 2018-03-08: 17 g via ORAL
  Filled 2018-03-08: qty 1

## 2018-03-08 NOTE — Progress Notes (Signed)
Subjective:  Patient has no complaints other than he states that he is room sick and wants to walk.  He states when he leaves here he will stay with his parents until he fully is recovered.  He denies nausea vomiting or abdominal pain.  He also denies melena or rectal bleeding.  Objective: Blood pressure 132/85, pulse (!) 106, temperature 98.8 F (37.1 C), temperature source Oral, resp. rate 11, height 6' (1.829 m), weight 97.6 kg, SpO2 90 %. Patient is alert. He is tremulous but does not have asterixis. Sclera remains icteric. Abdomen is full but not tense or tender. No LE edema noted.  Labs/studies Results:   CBC Latest Ref Rng & Units 03/08/2018 03/07/2018 03/06/2018  WBC 4.0 - 10.5 K/uL 14.3(H) 13.9(H) 9.1  Hemoglobin 13.0 - 17.0 g/dL 9.4(L) 10.0(L) 11.0(L)  Hematocrit 39.0 - 52.0 % 26.7(L) 27.7(L) 29.9(L)  Platelets 150 - 400 K/uL 94(L) 99(L) 94(L)    CMP Latest Ref Rng & Units 03/08/2018 03/07/2018 03/06/2018  Glucose 70 - 99 mg/dL 136(H) 146(H) 154(H)  BUN 6 - 20 mg/dL 22(H) 26(H) 27(H)  Creatinine 0.61 - 1.24 mg/dL 1.29(H) 1.92(H) 2.07(H)  Sodium 135 - 145 mmol/L 131(L) 124(L) 120(L)  Potassium 3.5 - 5.1 mmol/L 5.1 5.2(H) 4.8  Chloride 98 - 111 mmol/L 100 95(L) 91(L)  CO2 22 - 32 mmol/L 24 22 20(L)  Calcium 8.9 - 10.3 mg/dL 8.6(L) 8.1(L) 8.1(L)  Total Protein 6.5 - 8.1 g/dL 6.8 6.5 -  Total Bilirubin 0.3 - 1.2 mg/dL 18.6(HH) 19.7(HH) -  Alkaline Phos 38 - 126 U/L 89 93 -  AST 15 - 41 U/L 141(H) 187(H) -  ALT 0 - 44 U/L 72(H) 80(H) -    Hepatic Function Latest Ref Rng & Units 03/08/2018 03/07/2018 03/06/2018  Total Protein 6.5 - 8.1 g/dL 6.8 6.5 6.8  Albumin 3.5 - 5.0 g/dL 2.6(L) 2.0(L) 2.1(L)  AST 15 - 41 U/L 141(H) 187(H) 246(H)  ALT 0 - 44 U/L 72(H) 80(H) 91(H)  Alk Phosphatase 38 - 126 U/L 89 93 104  Total Bilirubin 0.3 - 1.2 mg/dL 18.6(HH) 19.7(HH) 22.6(HH)  Bilirubin, Direct 0.0 - 0.2 mg/dL - - 14.8(H)    No results found for: CRP    Assessment:  #1.   Decompensated liver disease.  Etiology is alcohol abuse and he also has tested positive for HCV antibody.. It remains to be seen if he is still viremic in which case he will be treated at a later date.  Hepatic function is improving as evidenced by decrease in INR.  Bilirubin is continuing to drop. Patient's condition discussed at length with him and his dad who is at bedside.  She has no hepatic reserve any fevers to go back to drinking again his prognosis would be very poor.  Given his young age he may require liver transplant down the road and therefore it is imperative that he goes rehab on an outpatient basis.  #2.  AKI.  Significant recovery of renal function in the last 48 hours.  Etiology felt to be dehydration and perhaps alcoholic liver disease..  #3.  Hyponatremia.  Serum sodium is near normal..  #4.  Ascites.  He has minimal ascites.  It was not significant enough for him to undergo abdominal paracenteses.   Plan:  Change methylprednisolone to oral route. Will start him on 40 mg daily.  Dose will be reduced to 30 mg daily when he is discharged. Will consider EGD on an outpatient basis. Patient stable to be transferred to   the floor.      

## 2018-03-08 NOTE — Progress Notes (Signed)
PROGRESS NOTE    Charles Hebert  ZOX:[id number] DOB: [birth year] DOA: 03/05/2018 PCP: Smith Robert, MD   Brief Narrative:  Per HPI: Charles Hebert a 38 y.o.malewith medical history significant foralcohol abuse,HTN,was sent to the ED by his primary care provider after blood work showed low sodium.Patient reportsnoticing skin color change about 2 weeks ago,reports vomiting blood last episode this morning 3 times, black stools and loosestoolsover the past month.Averages 3-4 stools daily. Reports poor p.o. intake and fatigue over the past month. Reports gradualabdominal swellingwithtightness over the past 3 months,without leg swelling, no difficulty breathing. He reports abdominal pain right upper quadrant and epigastric area. No chest pain, no difficulty breathing. No seizures, no confusion. Patient lives with his father who is present at bedside.  Patient admits to drinking at least 10 beers every day.He tells me his last drink was yesterday- 1 beer,because he is trying to cut down. He started drinking at the age of 23. He drinks to avoid going into withdrawal. Has been on methadone since 2015,goes to a pain clinic in [place],[facility]. DeniesIV drug use.  Patient has been started on treatment for beer potomaniawith IV normal saline with improvement and sodium noted. AKI is also improving. GI consultation pending for alcoholic hepatitis/liver failure. Hepatitis panel with positive HCV antibody noted in right upper quadrant ultrasound with noted cirrhosis and ascites along with possible mass.  Assessment & Plan:   Active Problems:   Hyponatremia  1. Hepatitis likely multifactorial with alcohol use as well as positive HCV antibody with liver injury/failure.  Patient has a poor prognosis with meld score of 36. Currently awaiting quantitative hepatitis C result.  Bilirubin continues to remain elevated as well as liver enzymes, but they  are downtrending nicely.Continue on IV Protonix. Continue on Rocephin for SBP prophylaxis. Continue on Solu-Medrol.  INR result pending this morning.  Patient has not had bowel movement since admission and will start on MiraLAX for laxation. 2. 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 Sodium levels are improving steadily and IV fluid rate has been decreased and patient has been placed on 4 g sodium diet. We will continue to monitor with repeat labs in a.m. 3. AKI-improving. Baseline creatinine previously noted to be 0.69. This is likely prerenal in the setting of ongoing lisinopril use which has been discontinued. There is also possibility of hepatorenal syndrome. Continue to maintain hydration and recheck labs in a.m. monitor strict I's and O's.  Fluid balance has been negative over the last 24 hours, but despite this, he has shown improvement.   4. History of alcohol abuse. Patient is a high risk for withdrawal symptoms. Maintain on CIWA precautions. He was noted to be intoxicated on admission.  He has required 1 dose of Ativan overnight, but has otherwise been doing very well. 5. Chronic narcotic use with methadone. Continue home methadone and confirm dosage with pharmacy. 6. Hypertension. Blood pressure this morning is improved. We will hold lisinopril due to AKI. Hydralazine to continue as needed.   DVT prophylaxis:SCDs Code Status:Full Family Communication:Father at bedside Disposition Plan:Continue IV normal saline and monitor on CIWA protocol. GI consultation appreciated with further evaluation of hepatitis pending.    Transfer to telemetry when bed available.  Diet has been advanced.  Will start MiraLAX for laxation.   Consultants:  GI  Procedures:  None  Antimicrobials:  Rocephin 2/25->   Subjective: Patient seen and evaluated today with no new acute complaints or concerns. No acute concerns or events noted overnight.  He has slept  well overnight  and is tolerating his dietary intake.  No bowel movement since admission.  He had some mild anxiety overnight for which he required 1 dose of Ativan, but has otherwise done quite well.  Objective: Vitals:   03/08/18 0200 03/08/18 0300 03/08/18 0400 03/08/18 0500  BP: 135/87 122/78    Pulse: (!) 101 90    Resp: 17 11    Temp:   98.7 F (37.1 C)   TempSrc:   Oral   SpO2: 95% 92%    Weight:    97.6 kg  Height:        Intake/Output Summary (Last 24 hours) at 03/08/2018 0706 Last data filed at 03/08/2018 0500 Gross per 24 hour  Intake 1596.12 ml  Output 3470 ml  Net -1873.88 ml   Filed Weights   03/05/18 2300 03/07/18 0500 03/08/18 0500  Weight: 95.3 kg 96.9 kg 97.6 kg    Examination:  General exam: Appears calm and comfortable, appears less jaundiced Respiratory system: Clear to auscultation. Respiratory effort normal. Cardiovascular system: S1 & S2 heard, RRR. No JVD, murmurs, rubs, gallops or clicks. No pedal edema. Gastrointestinal system: Abdomen is distended, soft and nontender. No organomegaly or masses felt. Normal bowel sounds heard. Central nervous system: Alert and oriented. No focal neurological deficits. Extremities: Symmetric 5 x 5 power. Skin: No rashes, lesions or ulcers Psychiatry: Judgement and insight appear normal. Mood & affect appropriate.     Data Reviewed: I have personally reviewed following labs and imaging studies  CBC: Recent Labs  Lab 03/05/18 1610 03/06/18 0540 03/07/18 0259  WBC 12.5* 9.1 13.9*  HGB 11.5* 11.0* 10.0*  HCT 31.1* 29.9* 27.7*  MCV 96.3 97.7 98.9  PLT 107* 94* 99*   Basic Metabolic Panel: Recent Labs  Lab 03/05/18 1610  03/06/18 0540 03/06/18 1235 03/06/18 1901 03/07/18 0259 03/08/18 0423  NA 115*   < > 118* 119* 120* 124* 131*  K 4.7   < > 5.1 4.8 4.8 5.2* 5.1  CL 80*   < > 85* 89* 91* 95* 100  CO2 22   < > 22 21* 20* 22 24  GLUCOSE 108*   < > 134* 161* 154* 146* 136*  BUN 27*   < > 28* 26* 27* 26* 22*    CREATININE 3.02*   < > 2.78* 2.38* 2.07* 1.92* 1.29*  CALCIUM 8.5*   < > 8.2* 8.1* 8.1* 8.1* 8.6*  MG 2.2  --   --   --   --   --   --   PHOS 5.6*  --   --   --   --   --   --    < > = values in this interval not displayed.   GFR: Estimated Creatinine Clearance: 94 mL/min (A) (by C-G formula based on SCr of 1.29 mg/dL (H)). Liver Function Tests: Recent Labs  Lab 03/05/18 1610 03/06/18 0540 03/07/18 0259 03/08/18 0423  AST 282* 246* 187* 141*  ALT 91* 91* 80* 72*  ALKPHOS 101 104 93 89  BILITOT 21.6*  22.6* 22.6* 19.7* 18.6*  PROT 7.1 6.8 6.5 6.8  ALBUMIN 2.3* 2.1* 2.0* 2.6*   No results for input(s): LIPASE, AMYLASE in the last 168 hours. No results for input(s): AMMONIA in the last 168 hours. Coagulation Profile: Recent Labs  Lab 03/05/18 2350 03/07/18 0259 03/08/18 0423  INR 1.6* 1.8* 1.5*   Cardiac Enzymes: No results for input(s): CKTOTAL, CKMB, CKMBINDEX, TROPONINI in the last 168 hours. BNP (last 3  results) No results for input(s): PROBNP in the last 8760 hours. HbA1C: No results for input(s): HGBA1C in the last 72 hours. CBG: No results for input(s): GLUCAP in the last 168 hours. Lipid Profile: No results for input(s): CHOL, HDL, LDLCALC, TRIG, CHOLHDL, LDLDIRECT in the last 72 hours. Thyroid Function Tests: Recent Labs    03/05/18 1610  TSH 1.771   Anemia Panel: No results for input(s): VITAMINB12, FOLATE, FERRITIN, TIBC, IRON, RETICCTPCT in the last 72 hours. Sepsis Labs: No results for input(s): PROCALCITON, LATICACIDVEN in the last 168 hours.  Recent Results (from the past 240 hour(s))  MRSA PCR Screening     Status: Abnormal   Collection Time: 03/05/18 10:43 PM  Result Value Ref Range Status   MRSA by PCR POSITIVE (A) NEGATIVE Final    Comment:        The GeneXpert MRSA Assay (FDA approved for NASAL specimens only), is one component of a comprehensive MRSA colonization surveillance program. It is not intended to diagnose MRSA infection  nor to guide or monitor treatment for MRSA infections. RESULT CALLED TO, READ BACK BY AND VERIFIED WITH: R WAGONER,RN @0426  03/06/18 Performed at [facility], [address], [place], [place] [address]          Radiology Studies: [place] Ascites (abdomen Limited)  Result Date: 03/07/2018 CLINICAL DATA:  Ascites. EXAM: LIMITED ABDOMEN ULTRASOUND FOR ASCITES TECHNIQUE: Limited ultrasound survey for ascites was performed in all four abdominal quadrants. COMPARISON:  Ultrasound of March 06, 2018. FINDINGS: Minimal to mild ascites is noted in the right lower quadrant. No ascites is noted in the other quadrants. IMPRESSION: Minimal to mild ascites is noted. Adequate fluid pocket for paracentesis is not visualized and therefore this was not performed. Electronically Signed   By: Lupita Raider, M.D.   On: 03/07/2018 09:31   US Abdomen Limited Ruq  Result Date: 03/06/2018 CLINICAL DATA:  Hyperbilirubinemia. History of liver failure, hypertension and alcohol abuse. EXAM: ULTRASOUND ABDOMEN LIMITED RIGHT UPPER QUADRANT COMPARISON:  None. FINDINGS: Gallbladder: Small amount of layering echogenic sludge in the gallbladder but no gallstones, wall thickening, pericholecystic fluid or sonographic Murphy sign to suggest acute cholecystitis. Common bile duct: Diameter: 4.7 mm Liver: Cirrhotic changes involving the liver with a very irregular liver contour, increased coarse echogenicity and associated ascites. Vague area of slight increased echogenicity noted near the portal bifurcation. This could be fat but a hepatoma would also be a possibility. Reversed flow in the portal vein. IMPRESSION: 1. 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 There is also ascites. 2. Vague 3 cm echogenic area noted near the portal vein bifurcation. This is indeterminate and may reflect focal fat but could not exclude hepatoma. MRI abdomen without and with contrast may be  helpful for further evaluation. 3. Echogenic sludge in the gallbladder but no definite gallstones or acute cholecystitis. Normal caliber common bile duct. Electronically Signed   By: Rudie Meyer M.D.   On: 03/06/2018 10:29        Scheduled Meds: . Chlorhexidine Gluconate Cloth  6 each Topical Q0600  . citalopram  20 mg Oral Daily  . folic acid  1 mg Oral Daily  . LORazepam  0-4 mg Intravenous Q12H  . methadone  50 mg Oral Daily  . methylPREDNISolone (SOLU-MEDROL) injection  40 mg Intravenous Daily  . multivitamin with minerals  1 tablet Oral Daily  . mupirocin ointment  1 application Nasal BID  . pantoprazole (PROTONIX)  IV  40 mg Intravenous Q12H  . thiamine  100 mg Oral Daily   Or  . thiamine  100 mg Intravenous Daily   Continuous Infusions: . sodium chloride 50 mL/hr at 03/08/18 0300  . cefTRIAXone (ROCEPHIN)  IV Stopped (03/07/18 2308)     LOS: 3 days    Time spent: 30 minutes    Adisyn Ruscitti Hoover Brunette, DO [facility] Pager [phone]  If 7PM-7AM, please contact night-coverage [email or web address] Password [facility] 03/08/2018, 7:06 AM

## 2018-03-08 NOTE — Progress Notes (Signed)
PHARMACIST - PHYSICIAN COMMUNICATION  DR:  Karilyn Cota  CONCERNING: IV- to- Oral Route Change Policy  RECOMMENDATION: This patient is receiving pantoprazole by the intravenous route.  Based on criteria approved by the Pharmacy and Therapeutics Committee, the intravenous medication(s) is/are being converted to the equivalent oral dose form(s).   DESCRIPTION: These criteria include:  The patient is eating (either orally or via tube) and/or has been taking other orally administered medications for a least 24 hours  The patient has no evidence of active gastrointestinal bleeding or impaired GI absorption (gastrectomy, short bowel, patient on TNA or NPO).  If you have questions about this conversion, please contact the Pharmacy Department  []   [phone] )  [facility] []   [phone] )  [facility] []   [phone] )  [facility] []   [phone] )  [facility] []   [phone] )  [facility]   [place], [facility] 03/08/2018 1:48 PM

## 2018-03-09 ENCOUNTER — Inpatient Hospital Stay (HOSPITAL_COMMUNITY): Payer: Medicaid Other

## 2018-03-09 DIAGNOSIS — R05 Cough: Secondary | ICD-10-CM

## 2018-03-09 DIAGNOSIS — R059 Cough, unspecified: Secondary | ICD-10-CM

## 2018-03-09 DIAGNOSIS — K7011 Alcoholic hepatitis with ascites: Secondary | ICD-10-CM

## 2018-03-09 LAB — COMPREHENSIVE METABOLIC PANEL
ALT: 80 U/L — AB (ref 0–44)
AST: 146 U/L — ABNORMAL HIGH (ref 15–41)
Albumin: 2.5 g/dL — ABNORMAL LOW (ref 3.5–5.0)
Alkaline Phosphatase: 87 U/L (ref 38–126)
Anion gap: 5 (ref 5–15)
BUN: 18 mg/dL (ref 6–20)
CO2: 26 mmol/L (ref 22–32)
CREATININE: 1.04 mg/dL (ref 0.61–1.24)
Calcium: 8.8 mg/dL — ABNORMAL LOW (ref 8.9–10.3)
Chloride: 99 mmol/L (ref 98–111)
GFR calc Af Amer: >60 mL/min (ref >60)
GFR calc non Af Amer: >60 mL/min (ref >60)
Glucose, Bld: 107 mg/dL — ABNORMAL HIGH (ref 70–99)
Potassium: 5 mmol/L (ref 3.5–5.1)
Sodium: 130 mmol/L — ABNORMAL LOW (ref 135–145)
Total Bilirubin: 17.4 mg/dL — ABNORMAL HIGH (ref 0.3–1.2)
Total Protein: 6.5 g/dL (ref 6.5–8.1)

## 2018-03-09 LAB — CBC
HCT: 27.7 % — ABNORMAL LOW (ref 39.0–52.0)
Hemoglobin: 9.4 g/dL — ABNORMAL LOW (ref 13.0–17.0)
MCH: 35.7 pg — ABNORMAL HIGH (ref 26.0–34.0)
MCHC: 33.9 g/dL (ref 30.0–36.0)
MCV: 105.3 fL — ABNORMAL HIGH (ref 80.0–100.0)
Platelets: 103 10*3/uL — ABNORMAL LOW (ref 150–400)
RBC: 2.63 MIL/uL — ABNORMAL LOW (ref 4.22–5.81)
RDW: 17.9 % — AB (ref 11.5–15.5)
WBC: 13.2 10*3/uL — ABNORMAL HIGH (ref 4.0–10.5)
nRBC: 0 % (ref 0.0–0.2)

## 2018-03-09 LAB — PROTIME-INR
INR: 1.6 — ABNORMAL HIGH (ref 0.8–1.2)
Prothrombin Time: 18.7 seconds — ABNORMAL HIGH (ref 11.4–15.2)

## 2018-03-09 MED ORDER — CHLORHEXIDINE GLUCONATE 0.12 % MT SOLN
15.0000 mL | Freq: Two times a day (BID) | OROMUCOSAL | Status: DC
  Administered 2018-03-10 (×2): 15 mL via OROMUCOSAL
  Filled 2018-03-09 (×2): qty 15

## 2018-03-09 MED ORDER — LORAZEPAM 2 MG/ML IJ SOLN
0.0000 mg | INTRAMUSCULAR | Status: AC | PRN
  Administered 2018-03-09: 2 mg via INTRAVENOUS
  Administered 2018-03-10: 1 mg via INTRAVENOUS
  Filled 2018-03-09: qty 2
  Filled 2018-03-09: qty 1

## 2018-03-09 MED ORDER — VANCOMYCIN HCL IN DEXTROSE 1-5 GM/200ML-% IV SOLN
1000.0000 mg | INTRAVENOUS | Status: AC
  Administered 2018-03-09 (×2): 1000 mg via INTRAVENOUS
  Filled 2018-03-09 (×2): qty 200

## 2018-03-09 MED ORDER — IPRATROPIUM BROMIDE 0.02 % IN SOLN
0.5000 mg | Freq: Four times a day (QID) | RESPIRATORY_TRACT | Status: DC
  Administered 2018-03-09 – 2018-03-12 (×13): 0.5 mg via RESPIRATORY_TRACT
  Filled 2018-03-09 (×13): qty 2.5

## 2018-03-09 MED ORDER — ORAL CARE MOUTH RINSE
15.0000 mL | Freq: Two times a day (BID) | OROMUCOSAL | Status: DC
  Administered 2018-03-10 (×2): 15 mL via OROMUCOSAL

## 2018-03-09 MED ORDER — SODIUM CHLORIDE 0.9 % IV SOLN
2.0000 g | Freq: Two times a day (BID) | INTRAVENOUS | Status: DC
  Administered 2018-03-09 – 2018-03-11 (×4): 2 g via INTRAVENOUS
  Filled 2018-03-09 (×4): qty 2

## 2018-03-09 MED ORDER — LEVALBUTEROL HCL 0.63 MG/3ML IN NEBU
0.6300 mg | INHALATION_SOLUTION | Freq: Four times a day (QID) | RESPIRATORY_TRACT | Status: DC
  Administered 2018-03-09 – 2018-03-12 (×13): 0.63 mg via RESPIRATORY_TRACT
  Filled 2018-03-09 (×13): qty 3

## 2018-03-09 NOTE — Progress Notes (Addendum)
   Assessment/Plan: ADMITTED WITH PROFOUND HYPONATREMIA/ETOH HEPATITIS, CLINICALLY IMPROVED BUT CONTINUES WITH HIGH 02 REQUIREMENT WHILE ON STEROIDS. ON DAY 4 ROCEPHIN. PTs WITH CIRRHOSIS MAY DEVELOP HEPATOPULOMONARY SYNDROME OR HIGH OUTPUT CARDIAC FAILURE DUE TO INTRAPULMONARY AV SHUNTING LEADING TO HYPOXIA.  PLAN: 1. CONTINUE PREDNISOLONE. 2. AWAIT RESULTS OF CHEST PA/LATERAL TODAY. 3. CONTINUE ROCEPHIN AND PROTONIX BID. 4. CONSIDER ECHO. DISCUSSED WITH DR. SHAH,PT , AND FAMILY.  1811: CXR consistent with PNA. D/C Prednisolone.  Subjective: Since last evaluated the patient AND FAMILY CONCERNED HE MAY HAVE PNEUMONIA DUE TO HIS COUGH. NURSING REPORTS THEY ARE UNABLE TO TITRATE HIS O2 DOWN.  Objective: Vital signs in last 24 hours: Vitals:   03/09/18 1153 03/09/18 1209  BP:    Pulse: (!) 109 (!) 106  Resp:    Temp:  97.9 F (36.6 C)  SpO2:  (!) 87%   General appearance: alert BUT SOMNOLENT, cooperative and no distress, COMPLETES FULL SENTENCES Resp: clear to auscultation ANTERIOR Scarlette Hogston bilaterally, FAIR AIR MOVEMENT. DECREASED BS IN BASES BILATERALLY Cardio: regular rate and rhythm GI: soft, non-tender; bowel sounds normal; MILD DISTENDED Extremities: extremities normal, atraumatic, no edema  Lab Results:  K 5.0 Cr 1.04 T BILI 17.4 WBC 13.2 Hb 9.4 INR 1.6 PLT CT 103  Studies/Results: No results found.  Medications: I have reviewed the patient's current medications.

## 2018-03-09 NOTE — Progress Notes (Addendum)
PROGRESS NOTE    Charles Hebert  RUE:[id number] DOB: 04-09-1980 DOA: 03/05/2018 PCP: Smith Robert, MD   Brief Narrative:  Per HPI: Charles Hebert a 38 y.o.malewith medical history significant foralcohol abuse,HTN,was sent to the ED by his primary care provider after blood work showed low sodium.Patient reportsnoticing skin color change about 2 weeks ago,reports vomiting blood last episode this morning 3 times, black stools and loosestoolsover the past month.Averages 3-4 stools daily. Reports poor p.o. intake and fatigue over the past month. Reports gradualabdominal swellingwithtightness over the past 3 months,without leg swelling, no difficulty breathing. He reports abdominal pain right upper quadrant and epigastric area. No chest pain, no difficulty breathing. No seizures, no confusion. Patient lives with his father who is present at bedside.  Patient admits to drinking at least 10 beers every day.He tells me his last drink was yesterday- 1 beer,because he is trying to cut down. He started drinking at the age of 84. He drinks to avoid going into withdrawal. Has been on methadone since 2015,goes to a pain clinic in [place],[facility]. DeniesIV drug use.  Patient has been started on treatment for beer potomaniawith IV normal saline with improvement and sodium noted. AKI is also improving. GI consultation pending for alcoholic hepatitis/liver failure. Hepatitis panelwith positive HCV antibody noted in right upper quadrant ultrasound with noted cirrhosis and ascites along with possible mass.  Assessment & Plan:   Active Problems:   Hyponatremia  1. Hepatitislikely multifactorial with alcohol use as well as positive HCV antibodywith liver injury/failure. Patient has a poor prognosis with meld score of 36. Currently awaiting quantitative hepatitis C result.  Bilirubin continues to remain elevated as well as liver enzymes, but they  are downtrending nicely.Continue on oral Protonix. Continue on Rocephin for SBP prophylaxis. Continue now on oral prednisone.INR  appears to remain elevated.  Patient has not had bowel movement since admission and will start on MiraLAX for laxation.  Will need to follow-up with GI outpatient for further care and endoscopy when ready for discharge. 2. Acute hypoxemic respiratory failure.  Patient has high oxygen requirements.  We will try incentive spirometry, up in chair, ambulation, and wean O2.  We will also placed on breathing treatments.  Patient will need to be weaned prior to discharge.  He does not appear to be significantly volume overloaded. 3. 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 Sodium levels are stable and IV fluid rate has been decreased and patient has been placed on 4 g sodium diet. We will continue to monitor with repeat labs in a.m. 4. AKI-improving. Baseline creatinine previously noted to be 0.69. This is likely prerenal in the setting of ongoing lisinopril use which has been discontinued. There is also possibility of hepatorenal syndrome. Continue to maintain hydration and recheck labs in a.m. monitor strict I's and O's.  Fluid balance has been negative over the last 24 hours, but despite this, he has shown improvement.   5. History of alcohol abuse. Patient is a high risk for withdrawal symptoms. Maintain on CIWA precautions. He was noted to be intoxicated on admission.  He has required 1 dose of Ativan overnight, but has otherwise been doing very well. 6. Chronic narcotic use with methadone. Continue home methadone and confirm dosage with pharmacy. 7. Hypertension. Blood pressure this morning isimproved. We will hold lisinopril due to AKI. Hydralazineto continue as needed.   DVT prophylaxis:SCDs Code Status:Full Family Communication:Father at bedside Disposition Plan:Continue IV normal saline and monitor on CIWA protocol. GI consultationappreciated  with further evaluation  of hepatitis pending.   Transfer to telemetry when bed available.    Try to wean oxygen today as he is on high requirements.  Up in chair and incentive spirometry ordered.   Consultants:  GI  Procedures:  None  Antimicrobials:  Rocephin 2/25->  Subjective: Patient seen and evaluated today with no new acute complaints or concerns. No acute concerns or events noted overnight.  He has had a bowel movement this morning and is tolerating his diet.  He continues to remain on high amounts of nasal cannula oxygen and has some mild coughing.  Objective: Vitals:   03/09/18 0400 03/09/18 0500 03/09/18 0700 03/09/18 0824  BP: (!) 144/90 139/89 126/78   Pulse: (!) 103 (!) 108 100   Resp: 16 (!) 26 17 15   Temp:    97.8 F (36.6 C)  TempSrc:    Oral  SpO2: 90% 93% 96%   Weight:      Height:        Intake/Output Summary (Last 24 hours) at 03/09/2018 0919 Last data filed at 03/09/2018 0400 Gross per 24 hour  Intake 2697.95 ml  Output 2350 ml  Net 347.95 ml   Filed Weights   03/05/18 2300 03/07/18 0500 03/08/18 0500  Weight: 95.3 kg 96.9 kg 97.6 kg    Examination:  General exam: Appears calm and comfortable  Respiratory system: Clear to auscultation. Respiratory effort normal.  Currently on 8-9 L of nasal cannula oxygen. Cardiovascular system: S1 & S2 heard, RRR. No JVD, murmurs, rubs, gallops or clicks. No pedal edema. Gastrointestinal system: Abdomen is nondistended, soft and nontender. No organomegaly or masses felt. Normal bowel sounds heard. Central nervous system: Alert and oriented. No focal neurological deficits. Extremities: Symmetric 5 x 5 power. Skin: No rashes, lesions or ulcers Psychiatry: Judgement and insight appear normal. Mood & affect appropriate.     Data Reviewed: I have personally reviewed following labs and imaging studies  CBC: Recent Labs  Lab 03/05/18 1610 03/06/18 0540 03/07/18 0259 03/08/18 0423 03/09/18 0433    WBC 12.5* 9.1 13.9* 14.3* 13.2*  HGB 11.5* 11.0* 10.0* 9.4* 9.4*  HCT 31.1* 29.9* 27.7* 26.7* 27.7*  MCV 96.3 97.7 98.9 104.3* 105.3*  PLT 107* 94* 99* 94* 103*   Basic Metabolic Panel: Recent Labs  Lab 03/05/18 1610  03/06/18 1235 03/06/18 1901 03/07/18 0259 03/08/18 0423 03/09/18 0433  NA 115*   < > 119* 120* 124* 131* 130*  K 4.7   < > 4.8 4.8 5.2* 5.1 5.0  CL 80*   < > 89* 91* 95* 100 99  CO2 22   < > 21* 20* 22 24 26   GLUCOSE 108*   < > 161* 154* 146* 136* 107*  BUN 27*   < > 26* 27* 26* 22* 18  CREATININE 3.02*   < > 2.38* 2.07* 1.92* 1.29* 1.04  CALCIUM 8.5*   < > 8.1* 8.1* 8.1* 8.6* 8.8*  MG 2.2  --   --   --   --   --   --   PHOS 5.6*  --   --   --   --   --   --    < > = values in this interval not displayed.   GFR: Estimated Creatinine Clearance: 116.6 mL/min (by C-G formula based on SCr of 1.04 mg/dL). Liver Function Tests: Recent Labs  Lab 03/05/18 1610 03/06/18 0540 03/07/18 0259 03/08/18 0423 03/09/18 0433  AST 282* 246* 187* 141* 146*  ALT 91* 91* 80*  72* 80*  ALKPHOS 101 104 93 89 87  BILITOT 21.6*  22.6* 22.6* 19.7* 18.6* 17.4*  PROT 7.1 6.8 6.5 6.8 6.5  ALBUMIN 2.3* 2.1* 2.0* 2.6* 2.5*   No results for input(s): LIPASE, AMYLASE in the last 168 hours. No results for input(s): AMMONIA in the last 168 hours. Coagulation Profile: Recent Labs  Lab 03/05/18 2350 03/07/18 0259 03/08/18 0423 03/09/18 0433  INR 1.6* 1.8* 1.5* 1.6*   Cardiac Enzymes: No results for input(s): CKTOTAL, CKMB, CKMBINDEX, TROPONINI in the last 168 hours. BNP (last 3 results) No results for input(s): PROBNP in the last 8760 hours. HbA1C: No results for input(s): HGBA1C in the last 72 hours. CBG: No results for input(s): GLUCAP in the last 168 hours. Lipid Profile: No results for input(s): CHOL, HDL, LDLCALC, TRIG, CHOLHDL, LDLDIRECT in the last 72 hours. Thyroid Function Tests: No results for input(s): TSH, T4TOTAL, FREET4, T3FREE, THYROIDAB in the last 72  hours. Anemia Panel: No results for input(s): VITAMINB12, FOLATE, FERRITIN, TIBC, IRON, RETICCTPCT in the last 72 hours. Sepsis Labs: No results for input(s): PROCALCITON, LATICACIDVEN in the last 168 hours.  Recent Results (from the past 240 hour(s))  MRSA PCR Screening     Status: Abnormal   Collection Time: 03/05/18 10:43 PM  Result Value Ref Range Status   MRSA by PCR POSITIVE (A) NEGATIVE Final    Comment:        The GeneXpert MRSA Assay (FDA approved for NASAL specimens only), is one component of a comprehensive MRSA colonization surveillance program. It is not intended to diagnose MRSA infection nor to guide or monitor treatment for MRSA infections. RESULT CALLED TO, READ BACK BY AND VERIFIED WITH: Jolly Mango @0426  03/06/18 Performed at [facility], [address], [place], [place] [address]          Radiology Studies: [place] Ascites (abdomen Limited)  Result Date: 03/07/2018 CLINICAL DATA:  Ascites. EXAM: LIMITED ABDOMEN ULTRASOUND FOR ASCITES TECHNIQUE: Limited ultrasound survey for ascites was performed in all four abdominal quadrants. COMPARISON:  Ultrasound of March 06, 2018. FINDINGS: Minimal to mild ascites is noted in the right lower quadrant. No ascites is noted in the other quadrants. IMPRESSION: Minimal to mild ascites is noted. Adequate fluid pocket for paracentesis is not visualized and therefore this was not performed. Electronically Signed   By: Lupita Raider, M.D.   On: 03/07/2018 09:31        Scheduled Meds: . Chlorhexidine Gluconate Cloth  6 each Topical Q0600  . citalopram  20 mg Oral Daily  . folic acid  1 mg Oral Daily  . ipratropium  0.5 mg Nebulization Q6H  . levalbuterol  0.63 mg Nebulization Q6H  . LORazepam  0-4 mg Intravenous Q12H  . methadone  50 mg Oral Daily  . multivitamin with minerals  1 tablet Oral Daily  . mupirocin ointment  1 application Nasal BID  . pantoprazole  40 mg Oral BID  . polyethylene glycol  17 g Oral Daily   . prednisoLONE  40 mg Oral Daily  . thiamine  100 mg Oral Daily   Continuous Infusions: . sodium chloride 50 mL/hr at 03/08/18 1946  . cefTRIAXone (ROCEPHIN)  IV 1 g (03/08/18 2319)     LOS: 4 days    Time spent: 30 minutes    Pratik Hoover Brunette, DO [facility] Pager [phone]  If 7PM-7AM, please contact night-coverage [email or web address] Password [facility] 03/09/2018, 9:19 AM

## 2018-03-09 NOTE — Progress Notes (Signed)
Pharmacy Antibiotic Note  Charles Hebert is a 38 y.o. male admitted on 03/05/2018 with possible HCAP.   Pharmacy has been consulted for vancomycin and cefepime  dosing.  Patient has received four days' worth of Rocephin therapy.  Plan: Start cefepime 2g IV q12h Loading dose:  vancomycin 1g IV q1h x2 doses to = total dose of vancomycin 2g Maintenance dose:  vancomycin 1.5g Iv q12h Goal vancomycin trough range:  15-20  mcg/mL Pharmacy will continue to monitor renal function, vancomycin troughs as clinically appropriate, cultures and patient progress.     Height: 6' (182.9 cm) Weight: 215 lb 2.7 oz (97.6 kg) IBW/kg (Calculated) : 77.6  Temp (24hrs), Avg:98.3 F (36.8 C), Min:97.8 F (36.6 C), Max:98.7 F (37.1 C)  Recent Labs  Lab 03/05/18 1610  03/06/18 0540 03/06/18 1235 03/06/18 1901 03/07/18 0259 03/08/18 0423 03/09/18 0433  WBC 12.5*  --  9.1  --   --  13.9* 14.3* 13.2*  CREATININE 3.02*   < > 2.78* 2.38* 2.07* 1.92* 1.29* 1.04   < > = values in this interval not displayed.    Estimated Creatinine Clearance: 116.6 mL/min (by C-G formula based on SCr of 1.04 mg/dL).    No Known Allergies  Antimicrobials this admission: ceftriaxone 2/25 >> 2/29 vancomycin 2/29 >>  cefepime 2/29>>   Microbiology results:  2/25 MRSA PCR: positive  Thank you for allowing pharmacy to be a part of this patient's care.  Tama High 03/09/2018 5:54 PM

## 2018-03-09 NOTE — Progress Notes (Addendum)
Have placed patient on BiPAP for low saturation 86-88 on high flow nasal cannula at 15 liter. Patient appears ok so far in that he is willing to wear mask. Saturation is 95 on BiPAP of 65 percent 14/8

## 2018-03-10 ENCOUNTER — Inpatient Hospital Stay (HOSPITAL_COMMUNITY): Payer: Medicaid Other

## 2018-03-10 DIAGNOSIS — I361 Nonrheumatic tricuspid (valve) insufficiency: Secondary | ICD-10-CM

## 2018-03-10 DIAGNOSIS — K7011 Alcoholic hepatitis with ascites: Secondary | ICD-10-CM

## 2018-03-10 DIAGNOSIS — E8809 Other disorders of plasma-protein metabolism, not elsewhere classified: Secondary | ICD-10-CM

## 2018-03-10 LAB — COMPREHENSIVE METABOLIC PANEL
ALT: 88 U/L — ABNORMAL HIGH (ref 0–44)
AST: 153 U/L — ABNORMAL HIGH (ref 15–41)
Albumin: 2.4 g/dL — ABNORMAL LOW (ref 3.5–5.0)
Alkaline Phosphatase: 82 U/L (ref 38–126)
Anion gap: 8 (ref 5–15)
BILIRUBIN TOTAL: 18.1 mg/dL — AB (ref 0.3–1.2)
BUN: 18 mg/dL (ref 6–20)
CALCIUM: 8.9 mg/dL (ref 8.9–10.3)
CO2: 28 mmol/L (ref 22–32)
Chloride: 96 mmol/L — ABNORMAL LOW (ref 98–111)
Creatinine, Ser: 1.07 mg/dL (ref 0.61–1.24)
GFR calc Af Amer: >60 mL/min (ref >60)
GFR calc non Af Amer: >60 mL/min (ref >60)
Glucose, Bld: 101 mg/dL — ABNORMAL HIGH (ref 70–99)
Potassium: 4.3 mmol/L (ref 3.5–5.1)
Sodium: 132 mmol/L — ABNORMAL LOW (ref 135–145)
TOTAL PROTEIN: 6.4 g/dL — AB (ref 6.5–8.1)

## 2018-03-10 LAB — ECHOCARDIOGRAM COMPLETE
Height: 72 in
Weight: 3442.7 oz

## 2018-03-10 LAB — PROTIME-INR
INR: 1.6 — ABNORMAL HIGH (ref 0.8–1.2)
Prothrombin Time: 19.1 seconds — ABNORMAL HIGH (ref 11.4–15.2)

## 2018-03-10 MED ORDER — VANCOMYCIN HCL 10 G IV SOLR: 1500.0000 mg | Freq: Two times a day (BID) | INTRAVENOUS | Status: DC

## 2018-03-10 MED ORDER — VANCOMYCIN HCL 1.5 G IV SOLR
1500.0000 mg | Freq: Two times a day (BID) | INTRAVENOUS | Status: DC
  Administered 2018-03-10 – 2018-03-12 (×4): 1500 mg via INTRAVENOUS
  Filled 2018-03-10 (×7): qty 1500

## 2018-03-10 MED ORDER — FUROSEMIDE 10 MG/ML IJ SOLN
40.0000 mg | Freq: Once | INTRAMUSCULAR | Status: AC
  Administered 2018-03-10: 40 mg via INTRAVENOUS
  Filled 2018-03-10: qty 4

## 2018-03-10 MED ORDER — FUROSEMIDE 10 MG/ML IJ SOLN
40.0000 mg | Freq: Two times a day (BID) | INTRAMUSCULAR | Status: AC
  Administered 2018-03-10 (×2): 40 mg via INTRAVENOUS
  Filled 2018-03-10 (×2): qty 4

## 2018-03-10 MED ORDER — METHADONE HCL 10 MG PO TABS
50.0000 mg | ORAL_TABLET | Freq: Every day | ORAL | Status: DC
  Administered 2018-03-10 – 2018-03-12 (×3): 50 mg via ORAL
  Filled 2018-03-10 (×3): qty 5

## 2018-03-10 MED ORDER — LORAZEPAM 2 MG/ML IJ SOLN
0.0000 mg | INTRAMUSCULAR | Status: AC | PRN
  Administered 2018-03-10 (×3): 4 mg via INTRAVENOUS
  Filled 2018-03-10 (×2): qty 2

## 2018-03-10 NOTE — Progress Notes (Signed)
PROGRESS NOTE    Charles Hebert  ZOX:[id number] DOB: 1980-04-08 DOA: 03/05/2018 PCP: Smith Robert, MD   Brief Narrative:  Per HPI:  Charles Hebert a 38 y.o.malewith medical history significant foralcohol abuse,HTN,was sent to the ED by his primary care provider after blood work showed low sodium.Patient reportsnoticing skin color change about 2 weeks ago,reports vomiting blood last episode this morning 3 times, black stools and loosestoolsover the past month.Averages 3-4 stools daily. Reports poor p.o. intake and fatigue over the past month. Reports gradualabdominal swellingwithtightness over the past 3 months,without leg swelling, no difficulty breathing. He reports abdominal pain right upper quadrant and epigastric area. No chest pain, no difficulty breathing. No seizures, no confusion. Patient lives with his father who is present at bedside.  Patient admits to drinking at least 10 beers every day.He tells me his last drink was yesterday- 1 beer,because he is trying to cut down. He started drinking at the age of 25. He drinks to avoid going into withdrawal. Has been on methadone since 2015,goes to a pain clinic in [place],[facility]. DeniesIV drug use.  Patient has been started on treatment for beer potomaniawith IV normal saline with improvement and sodium noted. AKI is also improving. GI consultation pending for alcoholic hepatitis/liver failure. Hepatitis panelwith positive HCV antibody noted in right upper quadrant ultrasound with noted cirrhosis and ascites along with possible mass.  He is developed hypoxemic respiratory failure with evidence of pneumonia on chest x-ray.  He also continues to have volume overload for which Lasix had been administered overnight with diuresis noted.  He is now on BiPAP this morning.   Assessment & Plan:   Active Problems:   Hyponatremia   Ascites due to alcoholic hepatitis   Cough  Hyperbilirubinemia  1. Hepatitislikely multifactorial with alcohol use as well as positive HCV antibodywith liver injury/failure. Patient has a poor prognosis with meld score of 36.Currently awaiting quantitative hepatitis C result. Bilirubin continues to remain elevated as well as liver enzymes.Continue on oral Protonix. Continue on Rocephin for SBP prophylaxis. Continue now on oral prednisone.INR appears to remain elevated.  Will need to follow-up with GI outpatient for further care and endoscopy when ready for discharge. 2. H CAP.  Continue on vancomycin and cefepime for now for empiric coverage until clinical improvement, at which point we will consider transition oral antibiotics.  Rocephin has been discontinued as result of initiation of these antibiotics.  Prednisone discontinued by GI. 3. Acute hypoxemic respiratory failure-worsening, related to above.  Patient has high oxygen requirements and is now on BiPAP.  We will try incentive spirometry, up in chair, ambulation, and wean BiPAP as tolerated.  Patient has responded well to Lasix overnight with 2 L diuresed.  We will continue to try Lasix for diuresis and maintain on vancomycin and cefepime for suspected H CAP.  Maintain in stepdown unit for now. 4. 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 Sodium levels are stableand IV fluid rate has been discontinued and patient has been placed on 4 g sodium diet. We will continue to monitor with repeat labs in a.m. 5. AKI-improving. Baseline creatinine previously noted to be 0.69. This is likely prerenal in the setting of ongoing lisinopril use which has been discontinued. There is also possibility of hepatorenal syndrome. Continue to maintain hydration and recheck labs in a.m. monitor strict I's and O's.Fluid balance has been negative over the last 24 hours, but despite this, he has shown improvement.  6. History of alcohol abuse. Patient is a high risk for  withdrawal symptoms. Maintain  on CIWA precautions. He was noted to be intoxicated on admission. 7. Chronic narcotic use with methadone. Continue home methadone and confirm dosage with pharmacy.  Will adjust dose to 9 AM for improved coverage and pain control. 8. Hypertension. Blood pressure this morning isimproved. We will hold lisinopril due to AKI. Hydralazineto continue as needed.   DVT prophylaxis:SCDs Code Status:Full Family Communication:None at bedside this morning. Disposition Plan:Continue IV normal saline and monitor on CIWA protocol. GI consultationappreciated with furtherevaluation of hepatitis pending.Continue to monitor in stepdown unit and work on hypoxemia and wean off BiPAP.  Continue on Lasix as well as vancomycin and cefepime.   Consultants:  GI  Procedures:  None  Antimicrobials:  Rocephin 2/25-2/29  Vancomycin and cefepime 2/29->   Subjective: Patient seen and evaluated today with some complaints of pain and discomfort with use of BiPAP overnight.  He has been trying to take off his BiPAP overnight due to the discomfort, but has required 65% FiO2 with pressure support due to hypoxemia.  Objective: Vitals:   03/10/18 0400 03/10/18 0500 03/10/18 0600 03/10/18 0700  BP: 122/77 109/61 117/76 130/79  Pulse: 93 94 93 99  Resp: 19  19 (!) 22  Temp:      TempSrc:      SpO2: 91% 93% 93% 94%  Weight:      Height:        Intake/Output Summary (Last 24 hours) at 03/10/2018 0711 Last data filed at 03/10/2018 0537 Gross per 24 hour  Intake 1797.58 ml  Output 3600 ml  Net -1802.42 ml   Filed Weights   03/05/18 2300 03/07/18 0500 03/08/18 0500  Weight: 95.3 kg 96.9 kg 97.6 kg    Examination:  General exam: Appears mildly anxious Respiratory system: Clear to auscultation. Respiratory effort normal.  Crackles at bases noted.  Minimal breath sounds otherwise.  Currently on BiPAP FiO2 65%. Cardiovascular system: S1 & S2 heard, RRR. No JVD, murmurs, rubs, gallops or  clicks.  Scant pedal edema. Gastrointestinal system: Abdomen is distended, soft and nontender. No organomegaly or masses felt. Normal bowel sounds heard. Central nervous system: Alert and oriented. No focal neurological deficits. Extremities: Symmetric 5 x 5 power. Skin: No rashes, lesions or ulcers Psychiatry: Judgement and insight appear normal. Mood & affect appropriate.     Data Reviewed: I have personally reviewed following labs and imaging studies  CBC: Recent Labs  Lab 03/05/18 1610 03/06/18 0540 03/07/18 0259 03/08/18 0423 03/09/18 0433  WBC 12.5* 9.1 13.9* 14.3* 13.2*  HGB 11.5* 11.0* 10.0* 9.4* 9.4*  HCT 31.1* 29.9* 27.7* 26.7* 27.7*  MCV 96.3 97.7 98.9 104.3* 105.3*  PLT 107* 94* 99* 94* 103*   Basic Metabolic Panel: Recent Labs  Lab 03/05/18 1610  03/06/18 1901 03/07/18 0259 03/08/18 0423 03/09/18 0433 03/10/18 0411  NA 115*   < > 120* 124* 131* 130* 132*  K 4.7   < > 4.8 5.2* 5.1 5.0 4.3  CL 80*   < > 91* 95* 100 99 96*  CO2 22   < > 20* 22 24 26 28   GLUCOSE 108*   < > 154* 146* 136* 107* 101*  BUN 27*   < > 27* 26* 22* 18 18  CREATININE 3.02*   < > 2.07* 1.92* 1.29* 1.04 1.07  CALCIUM 8.5*   < > 8.1* 8.1* 8.6* 8.8* 8.9  MG 2.2  --   --   --   --   --   --  PHOS 5.6*  --   --   --   --   --   --    < > = values in this interval not displayed.   GFR: Estimated Creatinine Clearance: 113.3 mL/min (by C-G formula based on SCr of 1.07 mg/dL). Liver Function Tests: Recent Labs  Lab 03/06/18 0540 03/07/18 0259 03/08/18 0423 03/09/18 0433 03/10/18 0411  AST 246* 187* 141* 146* 153*  ALT 91* 80* 72* 80* 88*  ALKPHOS 104 93 89 87 82  BILITOT 22.6* 19.7* 18.6* 17.4* 18.1*  PROT 6.8 6.5 6.8 6.5 6.4*  ALBUMIN 2.1* 2.0* 2.6* 2.5* 2.4*   No results for input(s): LIPASE, AMYLASE in the last 168 hours. No results for input(s): AMMONIA in the last 168 hours. Coagulation Profile: Recent Labs  Lab 03/05/18 2350 03/07/18 0259 03/08/18 0423 03/09/18 0433  03/10/18 0411  INR 1.6* 1.8* 1.5* 1.6* 1.6*   Cardiac Enzymes: No results for input(s): CKTOTAL, CKMB, CKMBINDEX, TROPONINI in the last 168 hours. BNP (last 3 results) No results for input(s): PROBNP in the last 8760 hours. HbA1C: No results for input(s): HGBA1C in the last 72 hours. CBG: No results for input(s): GLUCAP in the last 168 hours. Lipid Profile: No results for input(s): CHOL, HDL, LDLCALC, TRIG, CHOLHDL, LDLDIRECT in the last 72 hours. Thyroid Function Tests: No results for input(s): TSH, T4TOTAL, FREET4, T3FREE, THYROIDAB in the last 72 hours. Anemia Panel: No results for input(s): VITAMINB12, FOLATE, FERRITIN, TIBC, IRON, RETICCTPCT in the last 72 hours. Sepsis Labs: No results for input(s): PROCALCITON, LATICACIDVEN in the last 168 hours.  Recent Results (from the past 240 hour(s))  MRSA PCR Screening     Status: Abnormal   Collection Time: 03/05/18 10:43 PM  Result Value Ref Range Status   MRSA by PCR POSITIVE (A) NEGATIVE Final    Comment:        The GeneXpert MRSA Assay (FDA approved for NASAL specimens only), is one component of a comprehensive MRSA colonization surveillance program. It is not intended to diagnose MRSA infection nor to guide or monitor treatment for MRSA infections. RESULT CALLED TO, READ BACK BY AND VERIFIED WITH: R WAGONER,RN @0426  03/06/18 Performed at [facility], [address] [place], [place] [address]          Radiology Studies: Dg Chest 2 View  Result Date: 03/09/2018 CLINICAL DATA:  Per MD note: ADMITTED WITH PROFOUND HYPONATREMIA/ETOH HEPATITIS, CLINICALLY IMPROVED BUT CONTINUES WITH HIGH 02 REQUIREMENT WHILE ON STEROIDS. Cough and worsening SOB, liver failure EXAM: CHEST - 2 VIEW COMPARISON:  None. FINDINGS: Heart margins are partially obscured by significant opacity throughout the LEFT lung. Airspace filling opacities are identified bilaterally but worse on the LEFT. IMPRESSION: Significant bilateral airspace filling  opacities, LEFT greater than RIGHT. Possible RIGHT pleural effusion. Electronically Signed   By: Norva Pavlov M.D.   On: 03/09/2018 16:11        Scheduled Meds: . chlorhexidine  15 mL Mouth Rinse BID  . Chlorhexidine Gluconate Cloth  6 each Topical Q0600  . citalopram  20 mg Oral Daily  . folic acid  1 mg Oral Daily  . furosemide  40 mg Intravenous Q12H  . ipratropium  0.5 mg Nebulization Q6H  . levalbuterol  0.63 mg Nebulization Q6H  . mouth rinse  15 mL Mouth Rinse q12n4p  . methadone  50 mg Oral Daily  . multivitamin with minerals  1 tablet Oral Daily  . mupirocin ointment  1 application Nasal BID  . pantoprazole  40 mg Oral BID  . polyethylene glycol  17 g Oral Daily  . thiamine  100 mg Oral Daily   Continuous Infusions: . ceFEPime (MAXIPIME) IV 2 g (03/10/18 0537)     LOS: 5 days    Time spent: 30 minutes    Remberto Lienhard Hoover Brunette, DO [facility] Pager [phone]  If 7PM-7AM, please contact night-coverage [email or web address] Password [email or web address] 03/10/2018, 7:11 AM

## 2018-03-10 NOTE — Progress Notes (Signed)
PT refused CHG wipe bath for 0600. PT states he wants to wait later in the morning to ensure he has extra undergarments from home.

## 2018-03-10 NOTE — Progress Notes (Signed)
*  PRELIMINARY RESULTS* Echocardiogram 2D Echocardiogram has been performed.  Charles Hebert 03/10/2018, 11:41 AM

## 2018-03-10 NOTE — Progress Notes (Addendum)
Pt continues to remove BIPAP, desats into the 60s. Educated PT on use of BIPAP and reasoning. PRN ativan administered to decrease agitation and anxiety, hoping to improve adherence to the BIPAP. BIPAP placed on PT. PT O2 Sat remains around 90-92% with BIPAP. Auscultated expiratory wheezes and fine crackles. Gross ascites and edema noted. SOB at rest. MD contacted.

## 2018-03-10 NOTE — Progress Notes (Signed)
  Assessment/Plan: ADMITTED WITH DECOMPENSATED LIVER DISEASE/ETOHic HEPATITIS. LIVER PANEL IMPROVED BUT STILL CHILD PUGH C(10)/MELD score 26. NOW HAS HYPOXIC RESPIRATORY FAILURE-EDEMA V. HCAP. ECHO SHOWS NL EF.  PLAN: 1. STEROIDS STOPPED DUE TO POSSIBLE ONGOING INFECTION 2. PROTONIX BID 3. MONITOR Cr DURING DIURESIS 4. DISCUSSED GUARDED PROGNOSIS WITH HIS FATHER.   Subjective: Since I last evaluated the patient HE IS NOW ON BIPAP. HIS LAST DOSE OF STEROIDS WAS FEB 29.   Objective: Vital signs in last 24 hours: Vitals:   03/10/18 1000 03/10/18 1100  BP: 105/64 (!) 86/65  Pulse: (!) 102 95  Resp: 19 19  Temp:    SpO2: 91% 95%   General appearance: alert, appears older than stated age and no distress Resp: clear to auscultation bilaterally IN ANTERIOR LUNG Emma-Lee Oddo, DECREASED BREATH SOUNDS ON BASED BILATERALLY Cardio: regular rate and rhythm GI: soft, non-tender; bowel sounds normal;  Extremities: extremities normal, atraumatic, no edema  Lab Results:   T BILI 18.1 AST 153 ALT 88 INR 1.6 Na 132 Cr 1.07 ALBUMIN 2.4 ECHO: EF 5-60%   Studies/Results: Dg Chest 2 View  Result Date: 03/09/2018 CLINICAL DATA:  Per MD note: ADMITTED WITH PROFOUND HYPONATREMIA/ETOH HEPATITIS, CLINICALLY IMPROVED BUT CONTINUES WITH HIGH 02 REQUIREMENT WHILE ON STEROIDS. Cough and worsening SOB, liver failure EXAM: CHEST - 2 VIEW COMPARISON:  None. FINDINGS: Heart margins are partially obscured by significant opacity throughout the LEFT lung. Airspace filling opacities are identified bilaterally but worse on the LEFT. IMPRESSION: Significant bilateral airspace filling opacities, LEFT greater than RIGHT. Possible RIGHT pleural effusion. Electronically Signed   By: Norva Pavlov M.D.   On: 03/09/2018 16:11    Medications: I have reviewed the patient's current medications.

## 2018-03-10 NOTE — Progress Notes (Signed)
Patient does drop Saturation very quick when removed from BiPAP or regular oxygen source. This usually indicates fluid problem. Md notified , 40 mg Lasix administered by nurse 02:07

## 2018-03-11 ENCOUNTER — Inpatient Hospital Stay (HOSPITAL_COMMUNITY): Payer: Medicaid Other

## 2018-03-11 ENCOUNTER — Inpatient Hospital Stay (HOSPITAL_COMMUNITY): Payer: Medicaid Other | Admitting: Anesthesiology

## 2018-03-11 DIAGNOSIS — K7011 Alcoholic hepatitis with ascites: Secondary | ICD-10-CM

## 2018-03-11 DIAGNOSIS — E8809 Other disorders of plasma-protein metabolism, not elsewhere classified: Secondary | ICD-10-CM

## 2018-03-11 LAB — INFLUENZA PANEL BY PCR (TYPE A & B)
Influenza A By PCR: NEGATIVE
Influenza B By PCR: NEGATIVE

## 2018-03-11 LAB — BLOOD GAS, ARTERIAL
Acid-Base Excess: 4.4 mmol/L — ABNORMAL HIGH (ref 0.0–2.0)
Bicarbonate: 27.8 mmol/L (ref 20.0–28.0)
FIO2: 100
O2 Saturation: 97.2 %
PATIENT TEMPERATURE: 37.6
pCO2 arterial: 57 mmHg — ABNORMAL HIGH (ref 32.0–48.0)
pH, Arterial: 7.338 — ABNORMAL LOW (ref 7.350–7.450)
pO2, Arterial: 104 mmHg (ref 83.0–108.0)

## 2018-03-11 LAB — RESPIRATORY PANEL BY PCR
ADENOVIRUS-RVPPCR: NOT DETECTED
Bordetella pertussis: NOT DETECTED
CORONAVIRUS HKU1-RVPPCR: NOT DETECTED
CORONAVIRUS NL63-RVPPCR: NOT DETECTED
Chlamydophila pneumoniae: NOT DETECTED
Coronavirus 229E: NOT DETECTED
Coronavirus OC43: NOT DETECTED
Influenza A: NOT DETECTED
Influenza B: NOT DETECTED
Metapneumovirus: NOT DETECTED
Mycoplasma pneumoniae: NOT DETECTED
Parainfluenza Virus 1: NOT DETECTED
Parainfluenza Virus 2: NOT DETECTED
Parainfluenza Virus 3: NOT DETECTED
Parainfluenza Virus 4: NOT DETECTED
Respiratory Syncytial Virus: NOT DETECTED
Rhinovirus / Enterovirus: NOT DETECTED

## 2018-03-11 LAB — COMPREHENSIVE METABOLIC PANEL
ALT: 89 U/L — ABNORMAL HIGH (ref 0–44)
AST: 166 U/L — ABNORMAL HIGH (ref 15–41)
Albumin: 2.2 g/dL — ABNORMAL LOW (ref 3.5–5.0)
Alkaline Phosphatase: 72 U/L (ref 38–126)
Anion gap: 10 (ref 5–15)
BUN: 22 mg/dL — ABNORMAL HIGH (ref 6–20)
CHLORIDE: 93 mmol/L — AB (ref 98–111)
CO2: 28 mmol/L (ref 22–32)
Calcium: 8.1 mg/dL — ABNORMAL LOW (ref 8.9–10.3)
Creatinine, Ser: 1.5 mg/dL — ABNORMAL HIGH (ref 0.61–1.24)
GFR calc Af Amer: >60 mL/min (ref >60)
GFR calc non Af Amer: 58 mL/min — ABNORMAL LOW (ref >60)
Glucose, Bld: 88 mg/dL (ref 70–99)
POTASSIUM: 3.6 mmol/L (ref 3.5–5.1)
Sodium: 131 mmol/L — ABNORMAL LOW (ref 135–145)
Total Bilirubin: 18.7 mg/dL (ref 0.3–1.2)
Total Protein: 6.1 g/dL — ABNORMAL LOW (ref 6.5–8.1)

## 2018-03-11 LAB — AMMONIA: Ammonia: 62 umol/L — ABNORMAL HIGH (ref 9–35)

## 2018-03-11 LAB — GLUCOSE, CAPILLARY
Glucose-Capillary: 66 mg/dL — ABNORMAL LOW (ref 70–99)
Glucose-Capillary: 93 mg/dL (ref 70–99)

## 2018-03-11 LAB — CBC
HCT: 28 % — ABNORMAL LOW (ref 39.0–52.0)
Hemoglobin: 9.7 g/dL — ABNORMAL LOW (ref 13.0–17.0)
MCH: 35.9 pg — ABNORMAL HIGH (ref 26.0–34.0)
MCHC: 34.6 g/dL (ref 30.0–36.0)
MCV: 103.7 fL — ABNORMAL HIGH (ref 80.0–100.0)
NRBC: 0 % (ref 0.0–0.2)
Platelets: 107 10*3/uL — ABNORMAL LOW (ref 150–400)
RBC: 2.7 MIL/uL — ABNORMAL LOW (ref 4.22–5.81)
RDW: 16.7 % — ABNORMAL HIGH (ref 11.5–15.5)
WBC: 13.3 10*3/uL — ABNORMAL HIGH (ref 4.0–10.5)

## 2018-03-11 LAB — MAGNESIUM: Magnesium: 1.5 mg/dL — ABNORMAL LOW (ref 1.7–2.4)

## 2018-03-11 LAB — TRIGLYCERIDES: TRIGLYCERIDES: 159 mg/dL — AB (ref <150)

## 2018-03-11 LAB — PROTIME-INR
INR: 1.5 — ABNORMAL HIGH (ref 0.8–1.2)
Prothrombin Time: 18.3 seconds — ABNORMAL HIGH (ref 11.4–15.2)

## 2018-03-11 LAB — LACTIC ACID, PLASMA: Lactic Acid, Venous: 1.9 mmol/L (ref 0.5–1.9)

## 2018-03-11 MED ORDER — LORAZEPAM 2 MG/ML IJ SOLN
2.0000 mg | INTRAMUSCULAR | Status: AC | PRN
  Administered 2018-03-11 (×2): 2 mg via INTRAVENOUS
  Filled 2018-03-11 (×2): qty 1

## 2018-03-11 MED ORDER — LEVOFLOXACIN IN D5W 750 MG/150ML IV SOLN
750.0000 mg | INTRAVENOUS | Status: DC
  Administered 2018-03-11: 750 mg via INTRAVENOUS
  Filled 2018-03-11: qty 150

## 2018-03-11 MED ORDER — ETOMIDATE 2 MG/ML IV SOLN
INTRAVENOUS | Status: DC | PRN
  Administered 2018-03-11: 20 mg via INTRAVENOUS

## 2018-03-11 MED ORDER — PROPOFOL 1000 MG/100ML IV EMUL
0.0000 ug/kg/min | INTRAVENOUS | Status: DC
  Administered 2018-03-11: 20 ug/kg/min via INTRAVENOUS
  Filled 2018-03-11: qty 100

## 2018-03-11 MED ORDER — SUCCINYLCHOLINE CHLORIDE 20 MG/ML IJ SOLN
INTRAMUSCULAR | Status: DC | PRN
  Administered 2018-03-11: 120 mg via INTRAVENOUS

## 2018-03-11 MED ORDER — CHLORHEXIDINE GLUCONATE 0.12% ORAL RINSE (MEDLINE KIT)
15.0000 mL | Freq: Two times a day (BID) | OROMUCOSAL | Status: DC
  Administered 2018-03-11 – 2018-03-12 (×3): 15 mL via OROMUCOSAL

## 2018-03-11 MED ORDER — FENTANYL CITRATE (PF) 100 MCG/2ML IJ SOLN
100.0000 ug | INTRAMUSCULAR | Status: DC | PRN
  Administered 2018-03-11 – 2018-03-12 (×2): 100 ug via INTRAVENOUS
  Filled 2018-03-11 (×3): qty 2

## 2018-03-11 MED ORDER — BENZONATATE 100 MG PO CAPS: 100.0000 mg | ORAL_CAPSULE | Freq: Three times a day (TID) | ORAL | Status: DC

## 2018-03-11 MED ORDER — MIDAZOLAM 50MG/50ML (1MG/ML) PREMIX INFUSION
0.5000 mg/h | INTRAVENOUS | Status: DC
  Administered 2018-03-11: 0.5 mg/h via INTRAVENOUS
  Filled 2018-03-11 (×2): qty 50

## 2018-03-11 MED ORDER — SODIUM CHLORIDE 0.9 % IV SOLN: INTRAVENOUS | Status: DC

## 2018-03-11 MED ORDER — ALBUMIN HUMAN 25 % IV SOLN
25.0000 g | Freq: Four times a day (QID) | INTRAVENOUS | Status: AC
  Administered 2018-03-11: 12.5 g via INTRAVENOUS
  Administered 2018-03-11 – 2018-03-12 (×3): 25 g via INTRAVENOUS
  Filled 2018-03-11: qty 100
  Filled 2018-03-11 (×2): qty 50
  Filled 2018-03-11: qty 100

## 2018-03-11 MED ORDER — SODIUM CHLORIDE 0.9 % IV BOLUS
3000.0000 mL | Freq: Once | INTRAVENOUS | Status: AC
  Administered 2018-03-11: 3000 mL via INTRAVENOUS

## 2018-03-11 MED ORDER — SODIUM CHLORIDE 0.9 % IV SOLN
2.0000 g | Freq: Three times a day (TID) | INTRAVENOUS | Status: DC
  Administered 2018-03-11 – 2018-03-12 (×3): 2 g via INTRAVENOUS
  Filled 2018-03-11 (×3): qty 2

## 2018-03-11 MED ORDER — ORAL CARE MOUTH RINSE
15.0000 mL | OROMUCOSAL | Status: DC
  Administered 2018-03-11 – 2018-03-12 (×14): 15 mL via OROMUCOSAL

## 2018-03-11 MED ORDER — MAGNESIUM SULFATE 2 GM/50ML IV SOLN
2.0000 g | Freq: Once | INTRAVENOUS | Status: AC
  Administered 2018-03-11: 2 g via INTRAVENOUS
  Filled 2018-03-11: qty 50

## 2018-03-11 MED ORDER — DEXTROSE 50 % IV SOLN
INTRAVENOUS | Status: AC
  Administered 2018-03-11: 25 mL
  Filled 2018-03-11: qty 50

## 2018-03-11 MED ORDER — FENTANYL CITRATE (PF) 100 MCG/2ML IJ SOLN
100.0000 ug | INTRAMUSCULAR | Status: DC | PRN
  Administered 2018-03-11: 100 ug via INTRAVENOUS

## 2018-03-11 MED ORDER — ALBUMIN HUMAN 25 % IV SOLN: 25.0000 g | Freq: Four times a day (QID) | INTRAVENOUS | Status: DC

## 2018-03-11 NOTE — Anesthesia Procedure Notes (Signed)
Procedure Name: Intubation Performed by: Arbie Cookey, MD Pre-anesthesia Checklist: Patient identified, Emergency Drugs available, Suction available, Patient being monitored and Timeout performed Patient Re-evaluated:Patient Re-evaluated prior to induction Oxygen Delivery Method: Non-rebreather mask and Ambu bag Preoxygenation: Pre-oxygenation with 100% oxygen Induction Type: IV induction Laryngoscope Size: Glidescope and 3 Grade View: Grade II Tube type: Oral Tube size: 7.5 mm Number of attempts: 1 Airway Equipment and Method: Stylet Placement Confirmation: ETT inserted through vocal cords under direct vision,  positive ETCO2,  CO2 detector and breath sounds checked- equal and bilateral Secured at: 22 cm Dental Injury: Teeth and Oropharynx as per pre-operative assessment

## 2018-03-11 NOTE — Progress Notes (Signed)
CRITICAL VALUE ALERT  Critical Value: total bilirubin 18.7  Date & Time Notied:  03/11/2018 0600  Provider Notified: Robb Matar  Orders Received/Actions taken: awaiting any new orders

## 2018-03-11 NOTE — Consult Note (Addendum)
Consult requested by: [facility], Dr. Sherryll Burger Consult requested for: Hypoxic respiratory failure  HPI: This is a 38 year old who came to the hospital from his primary care provider after being found to have hyponatremia.  He also had skin color change and had been vomiting blood on the morning of admission.  He had black stools which were loose.  He has had increasing abdominal swelling.  He has significant alcohol history and drinks at least 10 beers every day.  He was felt to have beer poto mania on admission.  He also had acute kidney injury.  He initially improved but was noted to have pretty markedly elevated bilirubin and then developed hypoxia.  Chest x-ray done on the 29th showed a fairly extensive left lung infiltrate.  He is a little bit sleepy but tells me he feels like he is breathing okay.  No other new complaints but his history may not be reliable.  Most of the history has been from the medical record.  He had ultrasound that showed cirrhosis ascites and a possible mass.  He is on broad-spectrum antibiotics.  Past Medical History:  Diagnosis Date  . Alcohol abuse   . Hypertension   . Liver failure (HCC)      History reviewed. No pertinent family history.  He denies any family history of lung disease.  He does smoke so he may have some element of COPD.  Social History   Socioeconomic History  . Marital status: Single    Spouse name: Not on file  . Number of children: Not on file  . Years of education: Not on file  . Highest education level: Not on file  Occupational History  . Not on file  Social Needs  . Financial resource strain: Not on file  . Food insecurity:    Worry: Not on file    Inability: Not on file  . Transportation needs:    Medical: Not on file    Non-medical: Not on file  Tobacco Use  . Smoking status: Current Every Day Smoker    Packs/day: 1.50    Types: Cigarettes  . Smokeless tobacco: Never Used  Substance and Sexual Activity  . Alcohol use:  Yes    Comment: pt reports drinking #10 16oz cans of red apple ales daily, heavy for past 5 years  . Drug use: Not Currently    Comment: hx opiate abuse, on methadones  . Sexual activity: Not on file  Lifestyle  . Physical activity:    Days per week: Not on file    Minutes per session: Not on file  . Stress: Not on file  Relationships  . Social connections:    Talks on phone: Not on file    Gets together: Not on file    Attends religious service: Not on file    Active member of club or organization: Not on file    Attends meetings of clubs or organizations: Not on file    Relationship status: Not on file  Other Topics Concern  . Not on file  Social History Narrative  . Not on file     ROS: Except as mentioned above 10 point review of systems is negative but may not be reliable because of his sleepiness    Objective: Vital signs in last 24 hours: Temp:  [97.7 F (36.5 C)-98.8 F (37.1 C)] 98.8 F (37.1 C) (03/02 0400) Pulse Rate:  [85-109] 106 (03/02 0700) Resp:  [17-36] 26 (03/02 0700) BP: (83-135)/(50-83) 104/83 (03/02 0700)  SpO2:  [81 %-97 %] 93 % (03/02 0726) FiO2 (%):  [65 %] 65 % (03/01 0830) Weight:  [96.2 kg] 96.2 kg (03/02 0251) Weight change:  Last BM Date: 03/10/18  Intake/Output from previous day: 03/01 0701 - 03/02 0700 In: 1680 [P.O.:480; IV Piggyback:1200] Out: 900 [Urine:900]  PHYSICAL EXAM Constitutional: He is awake sleepy but will talk to me.  Eyes: He has significant scleral icterus.  Ears nose mouth and throat: His mucous membranes are slightly dry.  Neck is supple.  Hearing is grossly normal.  Cardiovascular: His heart is regular with normal heart sounds.  Respiratory: He has rales on the left.  Gastrointestinal: He has protuberant abdomen with what appears to be a fluid wave.  Musculoskeletal: Grossly normal strength upper and lower extremities bilaterally.  Neurological: He is sleepy.  Psychiatric: He is sleepy but seems to have normal mood and  affect although he has been anxious.  Lab Results: Basic Metabolic Panel: Recent Labs    03/10/18 0411 03/11/18 0412  NA 132* 131*  K 4.3 3.6  CL 96* 93*  CO2 28 28  GLUCOSE 101* 88  BUN 18 22*  CREATININE 1.07 1.50*  CALCIUM 8.9 8.1*  MG  --  1.5*   Liver Function Tests: Recent Labs    03/10/18 0411 03/11/18 0412  AST 153* 166*  ALT 88* 89*  ALKPHOS 82 72  BILITOT 18.1* 18.7*  PROT 6.4* 6.1*  ALBUMIN 2.4* 2.2*   No results for input(s): LIPASE, AMYLASE in the last 72 hours. No results for input(s): AMMONIA in the last 72 hours. CBC: Recent Labs    03/09/18 0433 03/11/18 0412  WBC 13.2* 13.3*  HGB 9.4* 9.7*  HCT 27.7* 28.0*  MCV 105.3* 103.7*  PLT 103* 107*   Cardiac Enzymes: No results for input(s): CKTOTAL, CKMB, CKMBINDEX, TROPONINI in the last 72 hours. BNP: No results for input(s): PROBNP in the last 72 hours. D-Dimer: No results for input(s): DDIMER in the last 72 hours. CBG: No results for input(s): GLUCAP in the last 72 hours. Hemoglobin A1C: No results for input(s): HGBA1C in the last 72 hours. Fasting Lipid Panel: No results for input(s): CHOL, HDL, LDLCALC, TRIG, CHOLHDL, LDLDIRECT in the last 72 hours. Thyroid Function Tests: No results for input(s): TSH, T4TOTAL, FREET4, T3FREE, THYROIDAB in the last 72 hours. Anemia Panel: No results for input(s): VITAMINB12, FOLATE, FERRITIN, TIBC, IRON, RETICCTPCT in the last 72 hours. Coagulation: Recent Labs    03/10/18 0411 03/11/18 0412  LABPROT 19.1* 18.3*  INR 1.6* 1.5*   Urine Drug Screen: Drugs of Abuse     Component Value Date/Time   LABOPIA NONE DETECTED 03/05/2018 0440   COCAINSCRNUR NONE DETECTED 03/05/2018 0440   COCAINSCRNUR NEGATIVE 08/23/2013 2102   LABBENZ POSITIVE (A) 03/05/2018 0440   AMPHETMU NONE DETECTED 03/05/2018 0440   THCU NONE DETECTED 03/05/2018 0440   LABBARB NONE DETECTED 03/05/2018 0440    Alcohol Level: No results for input(s): ETH in the last 72  hours. Urinalysis: No results for input(s): COLORURINE, LABSPEC, PHURINE, GLUCOSEU, HGBUR, BILIRUBINUR, KETONESUR, PROTEINUR, UROBILINOGEN, NITRITE, LEUKOCYTESUR in the last 72 hours.  Invalid input(s): APPERANCEUR Misc. Labs:   ABGS: No results for input(s): PHART, PO2ART, TCO2, HCO3 in the last 72 hours.  Invalid input(s): PCO2   MICROBIOLOGY: Recent Results (from the past 240 hour(s))  MRSA PCR Screening     Status: Abnormal   Collection Time: 03/05/18 10:43 PM  Result Value Ref Range Status   MRSA by PCR POSITIVE (A)  NEGATIVE Final    Comment:        The GeneXpert MRSA Assay (FDA approved for NASAL specimens only), is one component of a comprehensive MRSA colonization surveillance program. It is not intended to diagnose MRSA infection nor to guide or monitor treatment for MRSA infections. RESULT CALLED TO, READ BACK BY AND VERIFIED WITH: [facility]WAGON[facility],RN @0426  03/06/18 Performed at [facility], [address] [place], [place] [address]     Studies/Results: Dg Chest 2 View  Result Date: 03/09/2018 CLINICAL DATA:  Per MD note: ADMITTED WITH PROFOUND HYPONATREMIA/ETOH HEPATITIS, CLINICALLY IMPROVED BUT CONTINUES WITH HIGH 02 REQUIREMENT WHILE ON STEROIDS. Cough and worsening SOB, liver failure EXAM: CHEST - 2 VIEW COMPARISON:  None. FINDINGS: Heart margins are partially obscured by significant opacity throughout the LEFT lung. Airspace filling opacities are identified bilaterally but worse on the LEFT. IMPRESSION: Significant bilateral airspace filling opacities, LEFT greater than RIGHT. Possible RIGHT pleural effusion. Electronically Signed   By: Norva PavlovElizabeth  Brown M.D.   On: 03/09/2018 16:11    Medications:  Prior to Admission:  Medications Prior to Admission  Medication Sig Dispense Refill Last Dose  . acetaminophen (TYLENOL) 500 MG tablet Take 500-1,000 mg by mouth daily as needed for headache.   Past Week at Unknown time  . citalopram (CELEXA) 40 MG tablet Take 40 mg  by mouth daily.   03/05/2018 at Unknown time  . lisinopril (PRINIVIL,ZESTRIL) 20 MG tablet Take 20 mg by mouth daily.   Past Week at Unknown time  . LORazepam (ATIVAN) 1 MG tablet Take 1 mg by mouth daily as needed for anxiety.    Past Week at Unknown time  . methadone (DOLOPHINE) 10 MG/ML solution Take 50 mg by mouth every morning.   03/05/2018 at Unknown time  . Vitamin D, Ergocalciferol, (DRISDOL) 1.25 MG (50000 UT) CAPS capsule Take 1 capsule by mouth every Sunday.    03/03/2018 at Unknown time   Scheduled: . benzonatate  100 mg Oral TID  . chlorhexidine  15 mL Mouth Rinse BID  . citalopram  20 mg Oral Daily  . folic acid  1 mg Oral Daily  . ipratropium  0.5 mg Nebulization Q6H  . levalbuterol  0.63 mg Nebulization Q6H  . mouth rinse  15 mL Mouth Rinse q12n4p  . methadone  50 mg Oral Daily  . multivitamin with minerals  1 tablet Oral Daily  . mupirocin ointment  1 application Nasal BID  . pantoprazole  40 mg Oral BID  . polyethylene glycol  17 g Oral Daily  . thiamine  100 mg Oral Daily   Continuous: . sodium chloride    . ceFEPime (MAXIPIME) IV 2 g (03/11/18 0603)  . magnesium sulfate 1 - 4 g bolus IVPB    . vancomycin (VANCOCIN) 1500 mg/57700mL IVPB (Vial-Mate [employer]) Stopped (03/11/18 0217)   QIH:[id number]:hydrALAZINE, LORazepam, ondansetron **OR** ondansetron (ZOFRAN) IV  Assesment: He was admitted with hyponatremia thought to be related to beer potomania.  He has been being treated for this and has improved  He developed increasing hypoxia which is an ongoing issue and chest x-ray on the 29th showed substantial infiltrate on the left and chest x-ray this morning which I have personally reviewed also shows bilateral infiltrates.  He is requiring very high flow oxygen using a mask plus a high flow nasal cannula.  He has acute alcoholic hepatitis with cirrhosis and ascites  His albumin level is low so he will require nutritional support once he is stabilized  He  is critically ill with  high chance of succumbing to his illness Active Problems:   Hyponatremia   Ascites due to alcoholic hepatitis   Cough   Hyperbilirubinemia   Hypoalbuminemia    Plan: Considering the rapid progression on his chest x-ray and his poor oxygenation on current nonrebreather mask plus high flow nasal cannula I think were best to go ahead and intubate and place him on mechanical ventilation.  Discussed with Dr. Sherryll Burger    LOS: 6 days   Fredirick Maudlin 03/11/2018, 8:04 AM

## 2018-03-11 NOTE — Progress Notes (Addendum)
PROGRESS NOTE    Charles Hebert  XFG:[id number] DOB: 26-May-1980 DOA: 03/05/2018 PCP: Smith Robert, MD   Brief Narrative:  Per HPI:  Lauralyn Hebert a 38 y.o.malewith medical history significant foralcohol abuse,HTN,was sent to the ED by his primary care provider after blood work showed low sodium.Patient reportsnoticing skin color change about 2 weeks ago,reports vomiting blood last episode this morning 3 times, black stools and loosestoolsover the past month.Averages 3-4 stools daily. Reports poor p.o. intake and fatigue over the past month. Reports gradualabdominal swellingwithtightness over the past 3 months,without leg swelling, no difficulty breathing. He reports abdominal pain right upper quadrant and epigastric area. No chest pain, no difficulty breathing. No seizures, no confusion. Patient lives with his father who is present at bedside.  Patient admits to drinking at least 10 beers every day.He tells me his last drink was yesterday- 1 beer,because he is trying to cut down. He started drinking at the age of 34. He drinks to avoid going into withdrawal. Has been on methadone since 2015,goes to a pain clinic in [place]. DeniesIV drug use.  Patient has been started on treatment for beer potomaniawith IV normal saline with improvement and sodium noted. AKI is also improving. GI consultation pending for alcoholic hepatitis/liver failure. Hepatitis panelwith positive HCV antibody noted in right upper quadrant ultrasound with noted cirrhosis and ascites along with possible mass.  He is developed hypoxemic respiratory failure with evidence of pneumonia on chest x-ray.  He also continues to have volume overload for which Lasix had been administered overnight with diuresis noted.  He has developed some renal intolerance and will be given some IV fluid today.  He still remains on 100% oxygen via nonrebreather.   Assessment &  Plan:   Active Problems:   Hyponatremia   Ascites due to alcoholic hepatitis   Cough   Hyperbilirubinemia   Hypoalbuminemia   1. Hepatitislikely multifactorial with alcohol use as well as positive HCV antibodywith liver injury/failure. Patient has a poor prognosis with meld score of 36.Currently awaiting quantitative hepatitis C result. Bilirubin continues to remain elevated as well as liver enzymes.Continue onoralProtonix. Continue on Rocephin for SBP prophylaxis. Continue now on oral prednisone.INRappears to remain elevated.Will need to follow-up with GI outpatient for further care and endoscopy when ready for discharge.  Appreciate further GI input. 2. H CAP.  Continue on vancomycin and cefepime for now for empiric coverage until clinical improvement, at which point we will consider transition oral antibiotics.  Rocephin has been discontinued as result of initiation of these antibiotics.  Prednisone discontinued by GI.  Tessalon Perles started today for persistent cough. 3. Acute hypoxemic respiratory failure- persistent related to above with possible acute lung injury. Patient has high oxygen requirements and is now on nonrebreather mask. We will try incentive spirometry, up in chair, ambulation, and wean BiPAP as tolerated.  Patient has responded well to Lasix overnight with 2 L diuresed, but now has developed AKI and therefore will hold further Lasix.  Consultation placed to pulmonology for assistance in management due to persistent, severe hypoxemia.  May consider CT chest for PE evaluation once renal function improves if hypoxemia continues to persist. Maintain in stepdown unit for now. 4. 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 Sodium levels are stableand IV fluid has been resumed for 12 hours. and patient has been placed on 4 g sodium diet. We will continue to monitor with repeat labs in a.m. 5. AKI- creatinine elevated after diuresis once again. Baseline creatinine  previously noted to  be 0.69. This is likely prerenal in the setting of recent diuresis.  Ongoing lisinopril use has been discontinued on admission. There is also possibility of hepatorenal syndrome. Continue to maintain hydration and recheck labs in a.m. monitor strict I's and O's.Fluid balance has been negative over the last 24 hours, but despite this, he has shown improvement.  6. History of alcohol abuse. Patient is a high risk for withdrawal symptoms. Maintain on CIWA precautions. He was noted to be intoxicated on admission. 7. Chronic narcotic use with methadone. Continue home methadone and confirm dosage with pharmacy.  Will adjust dose to 9 AM for improved coverage and pain control. 8. Hypertension with tachycardia. Blood pressure this morning isimproved. We will hold lisinopril due to AKI. Hydralazineto continue as needed. 9. Hypomagnesemia.  Replete IV and recheck in a.m.   DVT prophylaxis:SCDs Code Status:Full Family Communication:None at bedside this morning. Disposition Plan:Continue IV normal saline and monitor on CIWA protocol. GI ongoing management appreciated.Continue to monitor in stepdown unit and work on hypoxemia and wean off BiPAP.  Continue on vancomycin and cefepime and hold further Lasix for now and start gentle IV fluid.  Consult to pulmonology for severe hypoxemia appreciated.   Consultants:  GI  Pulmonology  Procedures:  None  Antimicrobials:  Rocephin 2/25-2/29  Vancomycin and cefepime 2/29->    Subjective: Patient seen and evaluated today and is becoming quite tired of laying in the bed at the hospital.  He states that he is persistently coughing and this is causing abdominal pain since his abdomen is so distended.  No other acute events noted overnight.  He has been taken off BiPAP since he could not tolerate this very well.  Objective: Vitals:   03/11/18 0251 03/11/18 0300 03/11/18 0400 03/11/18 0500  BP:  (!) 98/57  (!) 100/56 117/65  Pulse:  (!) 102 (!) 103 (!) 106  Resp:  (!) 26 19 (!) 36  Temp:   98.8 F (37.1 C)   TempSrc:      SpO2:  94% (!) 88% 94%  Weight: 96.2 kg     Height:        Intake/Output Summary (Last 24 hours) at 03/11/2018 0724 Last data filed at 03/11/2018 0500 Gross per 24 hour  Intake 1680.04 ml  Output 900 ml  Net 780.04 ml   Filed Weights   03/07/18 0500 03/08/18 0500 03/11/18 0251  Weight: 96.9 kg 97.6 kg 96.2 kg    Examination:  General exam: Appears calm and comfortable  Respiratory system: Diminished to auscultation. Respiratory effort normal.  Continues to remain on nonrebreather mask. Cardiovascular system: S1 & S2 heard, RRR. No JVD, murmurs, rubs, gallops or clicks. No pedal edema. Gastrointestinal system: Abdomen is distended, soft and nontender. No organomegaly or masses felt. Normal bowel sounds heard. Central nervous system: Alert and oriented. No focal neurological deficits. Extremities: Symmetric 5 x 5 power. Skin: No rashes, lesions or ulcers Psychiatry: Judgement and insight appear normal. Mood & affect appropriate.     Data Reviewed: I have personally reviewed following labs and imaging studies  CBC: Recent Labs  Lab 03/06/18 0540 03/07/18 0259 03/08/18 0423 03/09/18 0433 03/11/18 0412  WBC 9.1 13.9* 14.3* 13.2* 13.3*  HGB 11.0* 10.0* 9.4* 9.4* 9.7*  HCT 29.9* 27.7* 26.7* 27.7* 28.0*  MCV 97.7 98.9 104.3* 105.3* 103.7*  PLT 94* 99* 94* 103* 107*   Basic Metabolic Panel: Recent Labs  Lab 03/05/18 1610  03/07/18 0259 03/08/18 0423 03/09/18 0433 03/10/18 0411 03/11/18 0412  NA 115*   < >  124* 131* 130* 132* 131*  K 4.7   < > 5.2* 5.1 5.0 4.3 3.6  CL 80*   < > 95* 100 99 96* 93*  CO2 22   < > GLUCOSE 108*   < > 146* 136* 107* 101* 88  BUN 27*   < > 26* 22* 18 18 22*  CREATININE 3.02*   < > 1.92* 1.29* 1.04 1.07 1.50*  CALCIUM 8.5*   < > 8.1* 8.6* 8.8* 8.9 8.1*  MG 2.2  --   --   --   --   --  1.5*  PHOS 5.6*  --    --   --   --   --   --    < > = values in this interval not displayed.   GFR: Estimated Creatinine Clearance: 80.3 mL/min (A) (by C-G formula based on SCr of 1.5 mg/dL (H)). Liver Function Tests: Recent Labs  Lab 03/07/18 0259 03/08/18 0423 03/09/18 0433 03/10/18 0411 03/11/18 0412  AST 187* 141* 146* 153* 166*  ALT 80* 72* 80* 88* 89*  ALKPHOS 93 89 87 82 72  BILITOT 19.7* 18.6* 17.4* 18.1* 18.7*  PROT 6.5 6.8 6.5 6.4* 6.1*  ALBUMIN 2.0* 2.6* 2.5* 2.4* 2.2*   No results for input(s): LIPASE, AMYLASE in the last 168 hours. No results for input(s): AMMONIA in the last 168 hours. Coagulation Profile: Recent Labs  Lab 03/07/18 0259 03/08/18 0423 03/09/18 0433 03/10/18 0411 03/11/18 0412  INR 1.8* 1.5* 1.6* 1.6* 1.5*   Cardiac Enzymes: No results for input(s): CKTOTAL, CKMB, CKMBINDEX, TROPONINI in the last 168 hours. BNP (last 3 results) No results for input(s): PROBNP in the last 8760 hours. HbA1C: No results for input(s): HGBA1C in the last 72 hours. CBG: No results for input(s): GLUCAP in the last 168 hours. Lipid Profile: No results for input(s): CHOL, HDL, LDLCALC, TRIG, CHOLHDL, LDLDIRECT in the last 72 hours. Thyroid Function Tests: No results for input(s): TSH, T4TOTAL, FREET4, T3FREE, THYROIDAB in the last 72 hours. Anemia Panel: No results for input(s): VITAMINB12, FOLATE, FERRITIN, TIBC, IRON, RETICCTPCT in the last 72 hours. Sepsis Labs: No results for input(s): PROCALCITON, LATICACIDVEN in the last 168 hours.  Recent Results (from the past 240 hour(s))  MRSA PCR Screening     Status: Abnormal   Collection Time: 03/05/18 10:43 PM  Result Value Ref Range Status   MRSA by PCR POSITIVE (A) NEGATIVE Final    Comment:        The GeneXpert MRSA Assay (FDA approved for NASAL specimens only), is one component of a comprehensive MRSA colonization surveillance program. It is not intended to diagnose MRSA infection nor to guide or monitor treatment  for MRSA infections. RESULT CALLED TO, READ BACK BY AND VERIFIED WITH: R WAGONER,RN  03/06/18 Performed at [facility], [address] [place], [place] [address]          Radiology Studies: Dg Chest 2 View  Result Date: 03/09/2018 CLINICAL DATA:  Per MD note: ADMITTED WITH PROFOUND HYPONATREMIA/ETOH HEPATITIS, CLINICALLY IMPROVED BUT CONTINUES WITH HIGH 02 REQUIREMENT WHILE ON STEROIDS. Cough and worsening SOB, liver failure EXAM: CHEST - 2 VIEW COMPARISON:  None. FINDINGS: Heart margins are partially obscured by significant opacity throughout the LEFT lung. Airspace filling opacities are identified bilaterally but worse on the LEFT. IMPRESSION: Significant bilateral airspace filling opacities, LEFT greater than RIGHT. Possible RIGHT pleural effusion. Electronically Signed   By: Norva Pavlov M.D.   On:  03/09/2018 16:11        Scheduled Meds: . benzonatate  100 mg Oral TID  . chlorhexidine  15 mL Mouth Rinse BID  . citalopram  20 mg Oral Daily  . folic acid  1 mg Oral Daily  . ipratropium  0.5 mg Nebulization Q6H  . levalbuterol  0.63 mg Nebulization Q6H  . mouth rinse  15 mL Mouth Rinse q12n4p  . methadone  50 mg Oral Daily  . multivitamin with minerals  1 tablet Oral Daily  . mupirocin ointment  1 application Nasal BID  . pantoprazole  40 mg Oral BID  . polyethylene glycol  17 g Oral Daily  . thiamine  100 mg Oral Daily   Continuous Infusions: . sodium chloride    . ceFEPime (MAXIPIME) IV 2 g (03/11/18 0603)  . magnesium sulfate 1 - 4 g bolus IVPB    . vancomycin (VANCOCIN) 1500 mg/549mL IVPB (Vial-Mate [employer]) Stopped (03/11/18 0217)     LOS: 6 days    Time spent: 30 minutes    Shelton Soler Hoover Brunette, DO [facility] Pager [phone]  If 7PM-7AM, please contact night-coverage [email or web address] Password [facility] 03/11/2018, 7:24 AM

## 2018-03-11 NOTE — Anesthesia Preprocedure Evaluation (Signed)
Anesthesia Evaluation    Airway Mallampati: III       Dental  (+) Chipped, Missing, Loose, Poor Dentition   Pulmonary Current Smoker,    + rhonchi  + decreased breath sounds+ wheezing unstable rales    Cardiovascular hypertension,      Neuro/Psych    GI/Hepatic   Endo/Other    Renal/GU      Musculoskeletal   Abdominal   Peds  Hematology   Anesthesia Other Findings ICU, failed bipap Severe bilateral pneumonia, setting of ETOH abuse with ascites- failed abd drainage. severelt carried, eroded, broken teeth  Reproductive/Obstetrics                             Anesthesia Physical Anesthesia Plan  ASA: IV and emergent  Anesthesia Plan: General   Post-op Pain Management:    Induction:   PONV Risk Score and Plan:   Airway Management Planned:   Additional Equipment:   Intra-op Plan:   Post-operative Plan:   Informed Consent:   Plan Discussed with: Anesthesiologist  Anesthesia Plan Comments:         Anesthesia Quick Evaluation

## 2018-03-11 NOTE — Progress Notes (Signed)
Subjective:  Patient is intubated but denies abdominal pain. History obtained from patient's mother who was at bedside.   Patient's mother states cough started 2 days ago.  Sputum then became blood-tinged.  No history of vomiting or regurgitation.   Current Medications: Current meds reviewed.  Objective: Blood pressure 115/65, pulse (!) 108, temperature 99.8 F (37.7 C), temperature source Axillary, resp. rate 19, height 6' (1.829 m), weight 96.2 kg, SpO2 97 %. Patient is on ventilatory support. He responds appropriately to questions. He remains jaundiced. Cardiac exam with regular rhythm normal S1 and S2.  No murmur gallop noted. Auscultation of lungs reveal vesicular breath sounds anteriorly.  He has bilateral crackles in both bases. Abdomen is protuberant.  Bowel sounds are normal.  On palpation it somewhat tense but no tenderness organomegaly or masses noted.  Abdominal exam is no different than last seen. He has trace edema involving his feet.   Labs/studies Results:  CBC Latest Ref Rng & Units 03/11/2018 03/09/2018 03/08/2018  WBC 4.0 - 10.5 K/uL 13.3(H) 13.2(H) 14.3(H)  Hemoglobin 13.0 - 17.0 g/dL 9.7(L) 9.4(L) 9.4(L)  Hematocrit 39.0 - 52.0 % 28.0(L) 27.7(L) 26.7(L)  Platelets 150 - 400 K/uL 107(L) 103(L) 94(L)    CMP Latest Ref Rng & Units 03/11/2018 03/10/2018 03/09/2018  Glucose 70 - 99 mg/dL 88 101(H) 107(H)  BUN 6 - 20 mg/dL 22(H) 18 18  Creatinine 0.61 - 1.24 mg/dL 1.50(H) 1.07 1.04  Sodium 135 - 145 mmol/L 131(L) 132(L) 130(L)  Potassium 3.5 - 5.1 mmol/L 3.6 4.3 5.0  Chloride 98 - 111 mmol/L 93(L) 96(L) 99  CO2 22 - 32 mmol/L '28 28 26  '$ Calcium 8.9 - 10.3 mg/dL 8.1(L) 8.9 8.8(L)  Total Protein 6.5 - 8.1 g/dL 6.1(L) 6.4(L) 6.5  Total Bilirubin 0.3 - 1.2 mg/dL 18.7(HH) 18.1(HH) 17.4(H)  Alkaline Phos 38 - 126 U/L 72 82 87  AST 15 - 41 U/L 166(H) 153(H) 146(H)  ALT 0 - 44 U/L 89(H) 88(H) 80(H)    Hepatic Function Latest Ref Rng & Units 03/11/2018 03/10/2018 03/09/2018   Total Protein 6.5 - 8.1 g/dL 6.1(L) 6.4(L) 6.5  Albumin 3.5 - 5.0 g/dL 2.2(L) 2.4(L) 2.5(L)  AST 15 - 41 U/L 166(H) 153(H) 146(H)  ALT 0 - 44 U/L 89(H) 88(H) 80(H)  Alk Phosphatase 38 - 126 U/L 72 82 87  Total Bilirubin 0.3 - 1.2 mg/dL 18.7(HH) 18.1(HH) 17.4(H)  Bilirubin, Direct 0.0 - 0.2 mg/dL - - -    HCVRNA by PCR is undetectable.  INR today was 1.5.  Assessment:  #1.  Decompensated alcoholic liver disease.  Hepatic function has improved since admission as evidenced by improvement in INR.  Prednisolone was discontinued yesterday because of concern for hospital-acquired pneumonia.  Now with new additional pulmonary insult hepatic dysfunction is likely to deteriorate.  Hepatic discriminant function today is 46.16.  His score was 55 on 03/06/2018.  Similarly MELD is 29 based on today's labs and was 36 last week. If hepatic function worsens and pulmonary process is not due to infection me prednisolone could be reintroduced. HCVRNA is undetectable.  Therefore he does not have hepatitis C infection.  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   #2.  Bilateral pulmonary infiltrates.  No baseline chest film available for comparison from admission.  However he did not have any respiratory symptoms are abnormal exam.  He now has developed extensive bilateral infiltrates resulting in hypoxemia and intubation.  Patient had normal ECHO yesterday.  Test for influenza negative.  Other tests are pending.  He is presumed to have hospital-acquired pneumonia but this may very well turn out to be non-cardiac pulmonary edema for which risk factors include hypoalbuminemia and cholestasis.  I do not believe he has spontaneous bacterial peritonitis given that he had minimal ascites which could not even be tapped.  He is presently on Levaquin cefepime and vancomycin.  He is also receiving IV albumin as requested by Dr. Luan Pulling.  #3.  Anemia.  He had coffee-ground emesis prior to  admission but no evidence of overt GI bleed.  Anemia would appear to be primarily due to acute illness.  He has been empirically treated for peptic ulcer disease.  EGD planned for later unless active bleeding noted.  #4.  Hyponatremia thought to be due to beer portomania and underlying liver disease.  Serum sodium has gradually corrected to just below normal where it is not clinically significant.  #5.  Thrombocytopenia felt to be secondary to cirrhosis but he may also have alcohol induced marrow suppression.  #5.  Acute kidney injury.  His renal function had returned to normal with hydration but now creatinine is going up again most likely due to diuretic therapy.   Recommendations:  We will continue to monitor LFTs and INR daily.

## 2018-03-11 NOTE — Plan of Care (Signed)
Discussed with patient plan of care for the evening, pain management and reason why to keep restraints on with some teach back displayed

## 2018-03-11 NOTE — Progress Notes (Signed)
Pharmacy Antibiotic Note  Charles Hebert is a 37 y.o. male admitted on 03/05/2018 with possible HCAP.   Pharmacy has been consulted for vancomycin and cefepime  dosing.  Patient has received four days' worth of Rocephin therapy.  Plan: Cefepime 2000 mg IV every 8 hours. Levaquin 750 mg IV every 24 hours. Vancomycin 1500 mg IV every 12 hours. Vanco level ordered. Monitor labs, c/s, and patient improvement.    Height: 6' (182.9 cm) Weight: 212 lb 1.3 oz (96.2 kg) IBW/kg (Calculated) : 77.6  Temp (24hrs), Avg:98.7 F (37.1 C), Min:97.7 F (36.5 C), Max:99.8 F (37.7 C)  Recent Labs  Lab 03/06/18 0540  03/07/18 0259 03/08/18 0423 03/09/18 0433 03/10/18 0411 03/11/18 0412  WBC 9.1  --  13.9* 14.3* 13.2*  --  13.3*  CREATININE 2.78*   < > 1.92* 1.29* 1.04 1.07 1.50*   < > = values in this interval not displayed.    Estimated Creatinine Clearance: 80.3 mL/min (A) (by C-G formula based on SCr of 1.5 mg/dL (H)).    No Known Allergies  Antimicrobials this admission: ceftriaxone 2/25 >> 2/29 vancomycin 2/29 >>  cefepime 2/29>>  Levaquin 3/2 >>    Microbiology results:  2/25 MRSA PCR: positive  Thank you for allowing pharmacy to be a part of this patient's care.  Tad Moore 03/11/2018 2:52 PM

## 2018-03-11 NOTE — Progress Notes (Signed)
Notified Dr. Sherryll Burger of patient BP with MAPs trending <65 and low urine output of less than 45ml/hr. Orders given to give 3 L NS bolus. Bolus started at this time. Will continue to monitor.

## 2018-03-11 NOTE — Progress Notes (Signed)
Patient struggles with discomfort on BiPAP have place a NRB mask on him while he sleeps. He has diuresed some. Will try to live him off BiPAP as long as his oxygen saturation will permit. He also has a thick beard which doesn't help with seal on BIPAP mask -- which add to his discomfort of wearing.

## 2018-03-11 NOTE — Progress Notes (Signed)
Spoke with patient RN regarding PICC placement.  Currently patient has 3 working  PIVs.  IV RN told patient RN it may be tomorrow 3/3 for PICC placement.  Patient RN verbalized understanding.

## 2018-03-12 ENCOUNTER — Inpatient Hospital Stay: Payer: Self-pay

## 2018-03-12 ENCOUNTER — Inpatient Hospital Stay (HOSPITAL_COMMUNITY): Payer: Medicaid Other

## 2018-03-12 LAB — COMPREHENSIVE METABOLIC PANEL
ALT: 77 U/L — ABNORMAL HIGH (ref 0–44)
ANION GAP: 12 (ref 5–15)
AST: 175 U/L — ABNORMAL HIGH (ref 15–41)
Albumin: 2.9 g/dL — ABNORMAL LOW (ref 3.5–5.0)
Alkaline Phosphatase: 56 U/L (ref 38–126)
BUN: 32 mg/dL — ABNORMAL HIGH (ref 6–20)
CO2: 22 mmol/L (ref 22–32)
Calcium: 8.2 mg/dL — ABNORMAL LOW (ref 8.9–10.3)
Chloride: 99 mmol/L (ref 98–111)
Creatinine, Ser: 3.19 mg/dL — ABNORMAL HIGH (ref 0.61–1.24)
GFR calc Af Amer: 27 mL/min — ABNORMAL LOW (ref >60)
GFR calc non Af Amer: 23 mL/min — ABNORMAL LOW (ref >60)
Glucose, Bld: 50 mg/dL — ABNORMAL LOW (ref 70–99)
Potassium: 4 mmol/L (ref 3.5–5.1)
Sodium: 133 mmol/L — ABNORMAL LOW (ref 135–145)
Total Bilirubin: 20.5 mg/dL (ref 0.3–1.2)
Total Protein: 6 g/dL — ABNORMAL LOW (ref 6.5–8.1)

## 2018-03-12 LAB — VANCOMYCIN, PEAK: VANCOMYCIN PK: 56 ug/mL — AB (ref 30–40)

## 2018-03-12 LAB — BLOOD GAS, ARTERIAL
Acid-base deficit: 1 mmol/L (ref 0.0–2.0)
Bicarbonate: 23 mmol/L (ref 20.0–28.0)
Drawn by: 21310
FIO2: 100
O2 Saturation: 95.7 %
PEEP: 5 cmH2O
PH ART: 7.32 — AB (ref 7.350–7.450)
Patient temperature: 37.5
RATE: 16 resp/min
VT: 620 mL
pCO2 arterial: 48.6 mmHg — ABNORMAL HIGH (ref 32.0–48.0)
pO2, Arterial: 93 mmHg (ref 83.0–108.0)

## 2018-03-12 LAB — CBC
HCT: 24.9 % — ABNORMAL LOW (ref 39.0–52.0)
Hemoglobin: 8.5 g/dL — ABNORMAL LOW (ref 13.0–17.0)
MCH: 36.3 pg — ABNORMAL HIGH (ref 26.0–34.0)
MCHC: 34.1 g/dL (ref 30.0–36.0)
MCV: 106.4 fL — ABNORMAL HIGH (ref 80.0–100.0)
PLATELETS: 96 10*3/uL — AB (ref 150–400)
RBC: 2.34 MIL/uL — ABNORMAL LOW (ref 4.22–5.81)
RDW: 17.4 % — ABNORMAL HIGH (ref 11.5–15.5)
WBC: 11.1 10*3/uL — AB (ref 4.0–10.5)
nRBC: 0 % (ref 0.0–0.2)

## 2018-03-12 LAB — GLUCOSE, CAPILLARY
Glucose-Capillary: 49 mg/dL — ABNORMAL LOW (ref 70–99)
Glucose-Capillary: 77 mg/dL (ref 70–99)
Glucose-Capillary: 112 mg/dL — ABNORMAL HIGH (ref 70–99)

## 2018-03-12 LAB — LACTIC ACID, PLASMA
Lactic Acid, Venous: 2.5 mmol/L (ref 0.5–1.9)
Lactic Acid, Venous: 2.4 mmol/L (ref 0.5–1.9)
Lactic Acid, Venous: 2.4 mmol/L (ref 0.5–1.9)

## 2018-03-12 LAB — LIPASE, BLOOD: Lipase: 52 U/L — ABNORMAL HIGH (ref 11–51)

## 2018-03-12 LAB — PROTIME-INR
INR: 1.7 — ABNORMAL HIGH (ref 0.8–1.2)
Prothrombin Time: 19.4 seconds — ABNORMAL HIGH (ref 11.4–15.2)

## 2018-03-12 LAB — MAGNESIUM: Magnesium: 1.9 mg/dL (ref 1.7–2.4)

## 2018-03-12 LAB — VANCOMYCIN, TROUGH: VANCOMYCIN TR: 46 ug/mL — AB (ref 15–20)

## 2018-03-12 MED ORDER — DEXTROSE-NACL 5-0.9 % IV SOLN
INTRAVENOUS | Status: DC
  Administered 2018-03-12: 10:00:00 via INTRAVENOUS

## 2018-03-12 MED ORDER — PHENYLEPHRINE HCL-NACL 10-0.9 MG/250ML-% IV SOLN
0.0000 ug/min | INTRAVENOUS | Status: DC
  Administered 2018-03-12 (×2): 30 ug/min via INTRAVENOUS
  Administered 2018-03-12: 20 ug/min via INTRAVENOUS
  Filled 2018-03-12 (×5): qty 250

## 2018-03-12 MED ORDER — LEVOFLOXACIN IN D5W 750 MG/150ML IV SOLN: 750.0000 mg | INTRAVENOUS | Status: DC

## 2018-03-12 MED ORDER — SODIUM CHLORIDE 0.9 % IV SOLN
INTRAVENOUS | Status: DC
  Administered 2018-03-12: 08:00:00 via INTRAVENOUS

## 2018-03-12 MED ORDER — SODIUM CHLORIDE 0.9 % IV SOLN: 2.0000 g | INTRAVENOUS | Status: DC

## 2018-03-12 MED ORDER — SODIUM CHLORIDE 0.9% FLUSH: 10.0000 mL | INTRAVENOUS | Status: DC | PRN

## 2018-03-12 MED ORDER — PANTOPRAZOLE SODIUM 40 MG IV SOLR
40.0000 mg | Freq: Two times a day (BID) | INTRAVENOUS | Status: DC
  Administered 2018-03-12: 40 mg via INTRAVENOUS
  Filled 2018-03-12: qty 40

## 2018-03-12 MED ORDER — SODIUM CHLORIDE 0.9% FLUSH
10.0000 mL | Freq: Two times a day (BID) | INTRAVENOUS | Status: DC
  Administered 2018-03-12: 10 mL

## 2018-03-12 MED ORDER — DEXTROSE 50 % IV SOLN
INTRAVENOUS | Status: AC
  Administered 2018-03-12: 09:00:00
  Filled 2018-03-12: qty 50

## 2018-03-12 MED ORDER — NOREPINEPHRINE 4 MG/250ML-% IV SOLN
0.0000 ug/min | INTRAVENOUS | Status: DC
  Administered 2018-03-12: 2 ug/min via INTRAVENOUS
  Filled 2018-03-12: qty 250

## 2018-03-12 MED ORDER — CHLORHEXIDINE GLUCONATE CLOTH 2 % EX PADS
6.0000 | MEDICATED_PAD | Freq: Every day | CUTANEOUS | Status: DC
  Administered 2018-03-12: 6 via TOPICAL

## 2018-03-12 NOTE — Discharge Summary (Signed)
Physician Discharge Summary  Charles Hebert ZOX:[id number] DOB: 05/24/1980 DOA: 03/05/2018  PCP: Charles Hebert, Stephen, MD  Admit date: 03/05/2018  Discharge date: 03/12/2018  Admitted From:Home  Dis[facility]  [facility]  Discharge Condition:Stable  CODE STATUS: Full  Transfer Summary: Per HPI on 2/25:  Charles Primesasey Chandleris a 37 y.o.malewith medical history significant foralcohol abuse,HTN,was sent to the ED by his primary care provider after blood work showed low sodium.Patient reportsnoticing skin color change about 2 weeks ago,reports vomiting blood last episode this morning 3 times, black stools and loosestoolsover the past month.Averages 3-4 stools daily. Reports poor p.o. intake and fatigue over the past month. Reports gradualabdominal swellingwithtightness over the past 3 months,without leg swelling, no difficulty breathing. He reports abdominal pain right upper quadrant and epigastric area. No chest pain, no difficulty breathing. No seizures, no confusion. Patient lives with his father who is present at bedside.  Patient admits to drinking at least 10 beers every day.He tells me his last drink was yesterday- 1 beer,because he is trying to cut down. He started drinking at the age of 38. He drinks to avoid going into withdrawal. Has been on methadone since 2015,[place]. DeniesIV drug use.  Clinical Course:  Patient was admitted on 2/25 for decompensated alcoholic liver cirrhosis and was started on IV methylprednisolone and was gradually transitioned to oral prednisone by GI who is following in consultation.  He was noted to stabilize from a liver standpoint and was actually doing better with anticipated discharge in 24 to 48 hours due to his improvement.  Unfortunately, on 2/29, patient was noted to become significantly hypoxemic and was on 8 L nasal cannula, whereas he was on 1-2L prior.  Chest x-ray  was obtained demonstrating what appeared to be findings of HCAP and he was empirically started on vancomycin and cefepime.  His hypoxemia continued to worsen and he required use of BiPAP which he would not tolerate.  On 3/2, repeat chest x-ray was obtained demonstrating worsening bilateral infiltrate and pulmonology was consulted for further evaluation.  At this time, it appeared necessary that the patient would require intubation, therefore he was intubated and placed on mechanical ventilation.  There was initial suspicion the patient may have infection with coronavirus and therefore he was placed in isolation in a negative pressure room.  His respiratory panel and flu swabs have returned negative at this point in time and his clinical condition continues to worsen with worsening infiltrates noted on repeat chest x-ray on 3/3.  ID was consulted regarding coronavirus testing, but they did not deem this necessary at the time and isolation precautions were discontinued.  A PICC line order was placed yesterday due to anticipated need for pressor use, and has been placed today.  He has required use of Neo-Synephrine overnight due to hypotension and mean arterial pressure in the 50-60 range.  Patient continues to have worsening renal function with elevated creatinine level and little to no urine output.    I have spoken with nephrology today and he is noted to have a potassium of 4 and serum bicarbonate of 22 and thus he is not an urgent need of dialysis, but may likely require CRRT given his clinical condition in the near future.  He is also developing hypoactive bowel sounds this morning.  He is noted to have increasing lactic acid level despite 30 cc/kg fluid bolus last night and is still with elevated INR as well as elevated bilirubin level.  GI  has evaluated patient yesterday and was noted to have a MELD score of 29.  Intensivist service was called at [facility] who have agreed to see patient in transfer for further  evaluation and management of his critical condition in the intensive care unit.  Dr. Catalina Lunger has accepted patient in transfer.  Discharge Diagnoses:  Active Problems:   Hyponatremia   Ascites due to alcoholic hepatitis   Cough   Hyperbilirubinemia   Hypoalbuminemia  1. Decompensated alcoholic liver cirrhosis with jaundice on presentation. Patient has a poor prognosis with meld score of 36 on admission and now at 29.Currently awaiting quantitative hepatitis C result. Bilirubin continues to remain elevated as well as liver enzymes.Continue onIV Protonix. Steroids discontinued earlier on account of development of HCAP. INRappears to remain elevated.He did receive albumin infusions yesterday and levels are improved. Will need to follow-up with GI upon transfer. 2. Septic shock secondary to HCAP with associated ALI/ARDS. Continue on vancomycin and cefepime along with addition of Levaquin yesterday. Maintain on mechanical ventilation with further care per intensivist service at [facility].  Follow-up lactic acid in a.m. Flu swab and respiratory panel negative. ID called regarding coronavirus testing, but stated that further testing was not needed at this time and removed isolation precautions. 3. Acute hypoxemic respiratory failure- related to above.  Further care per intensivist team at [facility] with ventilator weaning as tolerated. 4. 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 Sodium levels remain low, but stable.  Will maintain on IV normal saline with follow-up labs in a.m. 5. AKI-worsening with oliguria.  Nephrotoxic agents have been withheld, but this is related to above processes.  Continue IV fluid hydration for now and monitor strict I's and O's as well as daily weights.  Renal panel in a.m. 6. History of alcohol abuse. Patient is a high risk for withdrawal symptoms. Maintain on CIWA precautions. He was noted to be intoxicated on admission.  He has not had any significant periods  of agitation/anxiety while here. 7. Chronic narcotic use with methadone. Patient is currently on Versed drip.  This will likely linger in his system for several days.  Will not place on any IV narcotics at this time.  He will need to be monitored for withdrawal symptoms from this as his clinical condition continues to progress. 8. Hypertensionwith tachycardia. Blood pressure is very low with low mean arterial pressures for which Neo-Synephrine has been added for support overnight.  PICC line placement is currently pending. 9. Hypomagnesemia.   Repleted and improved this morning.  Will recheck in a.m.  No Known Allergies  Consultations:  GI  Pulmonology  Nephrology on phone 3/3   Procedures/Studies: Dg Chest 2 View  Result Date: 03/09/2018 CLINICAL DATA:  Per MD note: ADMITTED WITH PROFOUND HYPONATREMIA/ETOH HEPATITIS, CLINICALLY IMPROVED BUT CONTINUES WITH HIGH 02 REQUIREMENT WHILE ON STEROIDS. Cough and worsening SOB, liver failure EXAM: CHEST - 2 VIEW COMPARISON:  None. FINDINGS: Heart margins are partially obscured by significant opacity throughout the LEFT lung. Airspace filling opacities are identified bilaterally but worse on the LEFT. IMPRESSION: Significant bilateral airspace filling opacities, LEFT greater than RIGHT. Possible RIGHT pleural effusion. Electronically Signed   By: Norva Pavlov M.D.   On: 03/09/2018 16:11   Portable Chest Xray  Result Date: 03/12/2018 CLINICAL DATA:  Respiratory failure. EXAM: PORTABLE CHEST 1 VIEW COMPARISON:  March 11, 2018 FINDINGS: The heart size and mediastinal contours are stable. Endotracheal tube is identified distal tip 3 cm from carina. Nasogastric tube is identified distal tip not included  but is at least in the stomach. Diffuse opacities are identified throughout bilateral lungs which may be slightly worsened compared to prior exam the visualized skeletal structures are stable. IMPRESSION: Diffuse airspace opacity throughout bilateral  lungs, slightly worsened compared to prior exam. Electronically Signed   By: Sherian Rein M.D.   On: 03/12/2018 07:05   Dg Chest Port 1 View  Result Date: 03/11/2018 CLINICAL DATA:  38 year old male with respiratory failure, liver failure. EXAM: PORTABLE CHEST 1 VIEW COMPARISON:  03/09/2018 FINDINGS: Portable AP view at 0803 hours. Very low lung volumes. Confluent and reticulonodular bilateral pulmonary opacity obscuring some of the mediastinum and diaphragm. Similar lung opacity since 02/29/2021 allowing for lower lung volumes. Visualized tracheal air column is within normal limits. Paucity of bowel gas in the upper abdomen. No acute osseous abnormality identified. IMPRESSION: Very low lung volumes with widespread left greater than right nonspecific pulmonary opacity. Top differential considerations include asymmetric pulmonary edema, infection. Electronically Signed   By: Odessa Fleming M.D.   On: 03/11/2018 08:31   Dg Chest Port 1v Same Day  Result Date: 03/11/2018 CLINICAL DATA:  Endotracheal tube placement EXAM: PORTABLE CHEST 1 VIEW COMPARISON:  03/11/2018 FINDINGS: Endotracheal tube is in place, tip approximately 4.1 centimeters above the carina. Nasogastric tube is in place, tip at least the level of the distal stomach but not well seen. Heart size is obscured by significant pulmonary opacities. Diffuse airspace filling opacities throughout the lungs bilaterally, with increasing density. IMPRESSION: 1. Increasing bilateral airspace filling opacities. 2. Endotracheal tube is in place, tip approximately 4.1 centimeters above the carina. Electronically Signed   By: Norva Pavlov M.D.   On: 03/11/2018 09:50   [place] Ascites (abdomen Limited)  Result Date: 03/07/2018 CLINICAL DATA:  Ascites. EXAM: LIMITED ABDOMEN ULTRASOUND FOR ASCITES TECHNIQUE: Limited ultrasound survey for ascites was performed in all four abdominal quadrants. COMPARISON:  Ultrasound of March 06, 2018. FINDINGS: Minimal to mild ascites  is noted in the right lower quadrant. No ascites is noted in the other quadrants. IMPRESSION: Minimal to mild ascites is noted. Adequate fluid pocket for paracentesis is not visualized and therefore this was not performed. Electronically Signed   By: Lupita Raider, M.D.   On: 03/07/2018 09:31   [place] Ekg Site Rite  Result Date: 03/12/2018 If Site Rite image not attached, placement could not be confirmed due to current cardiac rhythm.  US Abdomen Limited Ruq  Result Date: 03/06/2018 CLINICAL DATA:  Hyperbilirubinemia. History of liver failure, hypertension and alcohol abuse. EXAM: ULTRASOUND ABDOMEN LIMITED RIGHT UPPER QUADRANT COMPARISON:  None. FINDINGS: Gallbladder: Small amount of layering echogenic sludge in the gallbladder but no gallstones, wall thickening, pericholecystic fluid or sonographic Murphy sign to suggest acute cholecystitis. Common bile duct: Diameter: 4.7 mm Liver: Cirrhotic changes involving the liver with a very irregular liver contour, increased coarse echogenicity and associated ascites. Vague area of slight increased echogenicity noted near the portal bifurcation. This could be fat but a hepatoma would also be a possibility. Reversed flow in the portal vein. IMPRESSION: 1. 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 There is also ascites. 2. Vague 3 cm echogenic area noted near the portal vein bifurcation. This is indeterminate and may reflect focal fat but could not exclude hepatoma. MRI abdomen without and with contrast may be helpful for further evaluation. 3. Echogenic sludge in the gallbladder but no definite gallstones or acute cholecystitis. Normal caliber common bile duct. Electronically Signed  By: Rudie Meyer M.D.   On: 03/06/2018 10:29     Discharge Exam: Vitals:   03/12/18 1300 03/12/18 1400  BP: (!) 99/40 (!) 100/42  Pulse: (!) 102 97  Resp: (!) 23 (!) 21  Temp:    SpO2: 91% 91%   Vitals:    03/12/18 1126 03/12/18 1200 03/12/18 1300 03/12/18 1400  BP:  (!) 96/47 (!) 99/40 (!) 100/42  Pulse:  (!) 105 (!) 102 97  Resp:  (!) 26 (!) 23 (!) 21  Temp: 99 F (37.2 C)     TempSrc: Oral     SpO2:  90% 91% 91%  Weight:      Height:        General: Pt is alert, awake, not in acute distress Cardiovascular: RRR, S1/S2 +, no rubs, no gallops, tachycardic Respiratory: CTA bilaterally, no wheezing, no rhonchi, on ventilator 100% FiO2 PEEP 10 Abdominal: Soft, NT, ND, bowel sounds + distended Extremities: no edema, no cyanosis    The results of significant diagnostics from this hospitalization (including imaging, microbiology, ancillary and laboratory) are listed below for reference.     Microbiology: Recent Results (from the past 240 hour(s))  MRSA PCR Screening     Status: Abnormal   Collection Time: 03/05/18 10:43 PM  Result Value Ref Range Status   MRSA by PCR POSITIVE (A) NEGATIVE Final    Comment:        The GeneXpert MRSA Assay (FDA approved for NASAL specimens only), is one component of a comprehensive MRSA colonization surveillance program. It is not intended to diagnose MRSA infection nor to guide or monitor treatment for MRSA infections. RESULT CALLED TO, READ BACK BY AND VERIFIED WITH: R WAGONER,RN @0426  03/06/18 Performed at [facility], [address] [place], [place] [address]   Respiratory Panel by PCR     Status: None   Collection Time: 03/11/18 11:44 AM  Result Value Ref Range Status   Adenovirus NOT DETECTED NOT DETECTED Final   Coronavirus 229E NOT DETECTED NOT DETECTED Final    Comment: (NOTE) The Coronavirus on the Respiratory Panel, DOES NOT test for the novel  Coronavirus (2019 nCoV)    Coronavirus HKU1 NOT DETECTED NOT DETECTED Final   Coronavirus NL63 NOT DETECTED NOT DETECTED Final   Coronavirus OC43 NOT DETECTED NOT DETECTED Final   Metapneumovirus NOT DETECTED NOT DETECTED Final   Rhinovirus / Enterovirus NOT DETECTED NOT DETECTED Final    Influenza A NOT DETECTED NOT DETECTED Final   Influenza B NOT DETECTED NOT DETECTED Final   Parainfluenza Virus 1 NOT DETECTED NOT DETECTED Final   Parainfluenza Virus 2 NOT DETECTED NOT DETECTED Final   Parainfluenza Virus 3 NOT DETECTED NOT DETECTED Final   Parainfluenza Virus 4 NOT DETECTED NOT DETECTED Final   Respiratory Syncytial Virus NOT DETECTED NOT DETECTED Final   Bordetella pertussis NOT DETECTED NOT DETECTED Final   Chlamydophila pneumoniae NOT DETECTED NOT DETECTED Final   Mycoplasma pneumoniae NOT DETECTED NOT DETECTED Final    Comment: Performed at [facility], [address] [place], [place] [address]  Culture, blood (Routine X 2) w Reflex to ID Panel     Status: None (Preliminary result)   Collection Time: 03/12/18 12:25 PM  Result Value Ref Range Status   Specimen Description BLOOD LEFT FOREARM DRAWN BY RN  Final   Special Requests   Final    BOTTLES DRAWN AEROBIC AND ANAEROBIC Blood Culture adequate volume Performed at [facility], [address], [place],  [place] [address]    Culture PENDING  Incomplete   Report Status PENDING  Incomplete  Culture, blood (Routine X 2) w Reflex to ID Panel     Status: None (Preliminary result)   Collection Time: 03/12/18 12:27 PM  Result Value Ref Range Status   Specimen Description BLOOD PICC LINE DRAWN BY RN  Final   Special Requests   Final    BOTTLES DRAWN AEROBIC AND ANAEROBIC Blood Culture adequate volume Performed at [facility], [address], [place], [place] [address]    Culture PENDING  Incomplete   Report Status PENDING  Incomplete     Labs: BNP (last 3 results) No results for input(s): BNP in the last 8760 hours. Basic Metabolic Panel: Recent Labs  Lab 03/05/18 1610  03/08/18 0423 03/09/18 0433 03/10/18 0411 03/11/18 0412 03/12/18 0446  NA 115*   < > 131* 130* 132* 131* 133*  K 4.7   < > 5.1 5.0 4.3 3.6 4.0  CL 80*   < > 100 99 96* 93* 99  CO2 22   < > GLUCOSE 108*   < >  136* 107* 101* 88 50*  BUN 27*   < > 22* 18 18 22* 32*  CREATININE 3.02*   < > 1.29* 1.04 1.07 1.50* 3.19*  CALCIUM 8.5*   < > 8.6* 8.8* 8.9 8.1* 8.2*  MG 2.2  --   --   --   --  1.5* 1.9  PHOS 5.6*  --   --   --   --   --   --    < > = values in this interval not displayed.   Liver Function Tests: Recent Labs  Lab 03/08/18 0423 03/09/18 0433 03/10/18 0411 03/11/18 0412 03/12/18 0446  AST 141* 146* 153* 166* 175*  ALT 72* 80* 88* 89* 77*  ALKPHOS 89 87 82 72 56  BILITOT 18.6* 17.4* 18.1* 18.7* 20.5*  PROT 6.8 6.5 6.4* 6.1* 6.0*  ALBUMIN 2.6* 2.5* 2.4* 2.2* 2.9*   Recent Labs  Lab 03/12/18 0446  LIPASE 52*   Recent Labs  Lab 03/11/18 0902  AMMONIA 62*   CBC: Recent Labs  Lab 03/07/18 0259 03/08/18 0423 03/09/18 0433 03/11/18 0412 03/12/18 0446  WBC 13.9* 14.3* 13.2* 13.3* 11.1*  HGB 10.0* 9.4* 9.4* 9.7* 8.5*  HCT 27.7* 26.7* 27.7* 28.0* 24.9*  MCV 98.9 104.3* 105.3* 103.7* 106.4*  PLT 99* 94* 103* 107* 96*   Cardiac Enzymes: No results for input(s): CKTOTAL, CKMB, CKMBINDEX, TROPONINI in the last 168 hours. BNP: Invalid input(s): POCBNP CBG: Recent Labs  Lab 03/11/18 1628 03/11/18 1749 03/12/18 0809 03/12/18 1127  GLUCAP 66* 93 49* 77   D-Dimer No results for input(s): DDIMER in the last 72 hours. Hgb A1c No results for input(s): HGBA1C in the last 72 hours. Lipid Profile Recent Labs    03/11/18 0902  TRIG 159*   Thyroid function studies No results for input(s): TSH, T4TOTAL, T3FREE, THYROIDAB in the last 72 hours.  Invalid input(s): FREET3 Anemia work up No results for input(s): VITAMINB12, FOLATE, FERRITIN, TIBC, IRON, RETICCTPCT in the last 72 hours. Urinalysis    Component Value Date/Time   COLORURINE Yellow 08/23/2013 2102   APPEARANCEUR Clear 08/23/2013 2102   LABSPEC 1.015 08/23/2013 2102   PHURINE 5.0 08/23/2013 2102   GLUCOSEU Negative 08/23/2013 2102   HGBUR Negative 08/23/2013 2102   BILIRUBINUR Negative 08/23/2013 2102    KETONESUR Negative 08/23/2013 2102   PROTEINUR  Negative 08/23/2013 2102   NITRITE Negative 08/23/2013 2102   LEUKOCYTESUR Negative 08/23/2013 2102   Sepsis Labs Invalid input(s): PROCALCITONIN,  WBC,  LACTICIDVEN Microbiology Recent Results (from the past 240 hour(s))  MRSA PCR Screening     Status: Abnormal   Collection Time: 03/05/18 10:43 PM  Result Value Ref Range Status   MRSA by PCR POSITIVE (A) NEGATIVE Final    Comment:        The GeneXpert MRSA Assay (FDA approved for NASAL specimens only), is one component of a comprehensive MRSA colonization surveillance program. It is not intended to diagnose MRSA infection nor to guide or monitor treatment for MRSA infections. RESULT CALLED TO, READ BACK BY AND VERIFIED WITH: R WAGONER,RN  03/06/18 Performed at [facility], [address], [place], [place] [address]   Respiratory Panel by PCR     Status: None   Collection Time: 03/11/18 11:44 AM  Result Value Ref Range Status   Adenovirus NOT DETECTED NOT DETECTED Final   Coronavirus 229E NOT DETECTED NOT DETECTED Final    Comment: (NOTE) The Coronavirus on the Respiratory Panel, DOES NOT test for the novel  Coronavirus (2019 nCoV)    Coronavirus HKU1 NOT DETECTED NOT DETECTED Final   Coronavirus NL63 NOT DETECTED NOT DETECTED Final   Coronavirus OC43 NOT DETECTED NOT DETECTED Final   Metapneumovirus NOT DETECTED NOT DETECTED Final   Rhinovirus / Enterovirus NOT DETECTED NOT DETECTED Final   Influenza A NOT DETECTED NOT DETECTED Final   Influenza B NOT DETECTED NOT DETECTED Final   Parainfluenza Virus 1 NOT DETECTED NOT DETECTED Final   Parainfluenza Virus 2 NOT DETECTED NOT DETECTED Final   Parainfluenza Virus 3 NOT DETECTED NOT DETECTED Final   Parainfluenza Virus 4 NOT DETECTED NOT DETECTED Final   Respiratory Syncytial Virus NOT DETECTED NOT DETECTED Final   Bordetella pertussis NOT DETECTED NOT DETECTED Final   Chlamydophila pneumoniae NOT DETECTED NOT DETECTED  Final   Mycoplasma pneumoniae NOT DETECTED NOT DETECTED Final    Comment: Performed at [facility], [address] [place], [place] [address]  Culture, blood (Routine X 2) w Reflex to ID Panel     Status: None (Preliminary result)   Collection Time: 03/12/18 12:25 PM  Result Value Ref Range Status   Specimen Description BLOOD LEFT FOREARM DRAWN BY RN  Final   Special Requests   Final    BOTTLES DRAWN AEROBIC AND ANAEROBIC Blood Culture adequate volume Performed at [facility], [address], [place], [place] [address]    Culture PENDING  Incomplete   Report Status PENDING  Incomplete  Culture, blood (Routine X 2) w Reflex to ID Panel     Status: None (Preliminary result)   Collection Time: 03/12/18 12:27 PM  Result Value Ref Range Status   Specimen Description BLOOD PICC LINE DRAWN BY RN  Final   Special Requests   Final    BOTTLES DRAWN AEROBIC AND ANAEROBIC Blood Culture adequate volume Performed at [facility], [address] [place], [place] [address]    Culture PENDING  Incomplete   Report Status PENDING  Incomplete     Time coordinating discharge: 40 minutes  SIGNED:   Erick Blinks, DO [facility] 03/12/2018, 2:42 PM  If 7PM-7AM, please contact night-coverage [email or web address] Password [email or web address]

## 2018-03-12 NOTE — Progress Notes (Signed)
Patient transported to [facility]. Verbal report given to [employer], Charity fundraiser. Report also given bedside to [employer] which is the air transport who is transporting patient to [facility]. Patient is going to the MICU at [facility] room 4307. Patient and family aware. Verbalized understanding. Patient left with 1 peripheral IV in left forearm as well as a triple lumen PICC in the right upper forearm. Transport did not take Versed gtt, so 20 mL was wasted with Jolly Mango, RN.

## 2018-03-12 NOTE — Progress Notes (Addendum)
Family requesting patient to be transferred to [facility]. Dr. Sherryll Burger spoke with [facility] and [facility] requested a facesheet be sent to [facility]. I faxed facesheet to [facility] at [phone]  Patient now has bed at [facility] - bed 237. [facility] wanted fax of discharge summary; it has been faxed.

## 2018-03-12 NOTE — Progress Notes (Signed)
Pt's Respiratory Virus Panel negative and Influenza negative. Sputum Culture collected 03/12/18 am pending. Spoke with Harlow Ohms RN [phone], from infection prevention regarding testing for COVID-19. Per Infection Prevention MD (Dr. Drue Second who spoke with [facility]) patient does not meet criteria to be tested for COVID-19. Pt has had no known contact with infected persons and has not traveled outside the [place] in the past 14 days.  Pt should remain on contact and droplet precautions. Airborne is not indicated at this time.  Please call Cherrie directly with any questions.

## 2018-03-12 NOTE — Progress Notes (Signed)
CRITICAL VALUE ALERT  Critical Value:  Lactic 2.5, Total Bilirubin 20.5  Date & Time Notied:  03/12/18  Provider Notified: Dr. Karie Schwalbe. Opyd  Orders Received/Actions taken: none at this time.  Patient also had a temp 99.7 and given ice packs under both armpits.  Patient Vanc peak >50 and pharmacy text paged.

## 2018-03-12 NOTE — Progress Notes (Addendum)
Subjective: He has continued deterioration overnight.  He has had fever hypotension requiring Neo-Synephrine worsening chest x-ray increased lactic acid increased bilirubin.  Plans have been made to transfer him to [facility] which I think is appropriate  Objective: Vital signs in last 24 hours: Temp:  [98 F (36.7 C)-99.8 F (37.7 C)] 98 F (36.7 C) (03/03 0648) Pulse Rate:  [88-127] 109 (03/03 0648) Resp:  [15-34] 26 (03/03 0648) BP: (86-168)/(40-108) 99/44 (03/03 0633) SpO2:  [89 %-99 %] 93 % (03/03 0648) FiO2 (%):  [100 %] 100 % (03/03 0433) Weight:  [100 kg] 100 kg (03/03 0500) Weight change: 3.8 kg Last BM Date: 03/11/18  Intake/Output from previous day: 03/02 0701 - 03/03 0700 In: 4047.7 [I.V.:253.2; IV Piggyback:3794.5] Out: 225 [Urine:225]  PHYSICAL EXAM Constitutional: He is intubated and on the ventilator but does open his eyes.  Cardiovascular: He has mild tachycardia appears to be in sinus rhythm and I do not hear a gallop  Respiratory: He is intubated on the ventilator and has bilateral rhonchi and rales  Gastrointestinal: His abdomen is protuberant with ascites.  Skin: Deeply jaundiced  Neurological: No focal findings he does open his eyes  Lab Results:  Results for orders placed or performed during the hospital encounter of 03/05/18 (from the past 48 hour(s))  Protime-INR     Status: Abnormal   Collection Time: 03/11/18  4:12 AM  Result Value Ref Range   Prothrombin Time 18.3 (H) 11.4 - 15.2 seconds   INR 1.5 (H) 0.8 - 1.2    Comment: (NOTE) INR goal varies based on device and disease states. Performed at [facility], [address] [place], [place] [address]   CBC     Status: Abnormal   Collection Time: 03/11/18  4:12 AM  Result Value Ref Range   WBC 13.3 (H) 4.0 - 10.5 K/uL   RBC 2.70 (L) 4.22 - 5.81 MIL/uL   Hemoglobin 9.7 (L) 13.0 - 17.0 g/dL   HCT 28.0 (L) 39.0 - 52.0 %   MCV 103.7 (H) 80.0 - 100.0 fL   MCH 35.9 (H) 26.0 - 34.0 pg   MCHC  34.6 30.0 - 36.0 g/dL   RDW 16.7 (H) 11.5 - 15.5 %   Platelets 107 (L) 150 - 400 K/uL    Comment: SPECIMEN CHECKED FOR CLOTS   nRBC 0.0 0.0 - 0.2 %    Comment: Performed at [facility], [address] [place], [place] [address]  Comprehensive metabolic panel     Status: Abnormal   Collection Time: 03/11/18  4:12 AM  Result Value Ref Range   Sodium 131 (L) 135 - 145 mmol/L   Potassium 3.6 3.5 - 5.1 mmol/L   Chloride 93 (L) 98 - 111 mmol/L   CO2 28 22 - 32 mmol/L   Glucose, Bld 88 70 - 99 mg/dL   BUN 22 (H) 6 - 20 mg/dL   Creatinine, Ser 1.50 (H) 0.61 - 1.24 mg/dL   Calcium 8.1 (L) 8.9 - 10.3 mg/dL   Total Protein 6.1 (L) 6.5 - 8.1 g/dL   Albumin 2.2 (L) 3.5 - 5.0 g/dL   AST 166 (H) 15 - 41 U/L   ALT 89 (H) 0 - 44 U/L   Alkaline Phosphatase 72 38 - 126 U/L   Total Bilirubin 18.7 (HH) 0.3 - 1.2 mg/dL    Comment: CRITICAL RESULT CALLED TO, READ BACK BY AND VERIFIED WITH: WADE,S AT 6:00AM ON 03/11/18 BY FESTERMAN,C    GFR calc non Af Wyvonnia Lora  58 (L) >60 mL/min   GFR calc Af Amer >60 >60 mL/min   Anion gap 10 5 - 15    Comment: Performed at [facility], [address], [place], [place] [address]  Magnesium     Status: Abnormal   Collection Time: 03/11/18  4:12 AM  Result Value Ref Range   Magnesium 1.5 (L) 1.7 - 2.4 mg/dL    Comment: Performed at [facility], [address], [place], [place] [address]  Ammonia     Status: Abnormal   Collection Time: 03/11/18  9:02 AM  Result Value Ref Range   Ammonia 62 (H) 9 - 35 umol/L    Comment: Performed at [facility], [address], [place], [place] [address]  Triglycerides     Status: Abnormal   Collection Time: 03/11/18  9:02 AM  Result Value Ref Range   Triglycerides 159 (H) <150 mg/dL    Comment: Performed at [facility], [address], [place], [place] [address]  Draw ABG 1 hour after initiation of ventilator     Status: Abnormal   Collection Time: 03/11/18 10:55 AM  Result Value Ref Range   FIO2 100.00    pH, Arterial 7.338 (L)  7.350 - 7.450   pCO2 arterial 57.0 (H) 32.0 - 48.0 mmHg   pO2, Arterial 104 83.0 - 108.0 mmHg   Bicarbonate 27.8 20.0 - 28.0 mmol/L   Acid-Base Excess 4.4 (H) 0.0 - 2.0 mmol/L   O2 Saturation 97.2 %   Patient temperature 37.6    Allens test (pass/fail) PASS PASS    Comment: Performed at [facility], [address], [place], [place] [address]  Respiratory Panel by PCR     Status: None   Collection Time: 03/11/18 11:44 AM  Result Value Ref Range   Adenovirus NOT DETECTED NOT DETECTED   Coronavirus 229E NOT DETECTED NOT DETECTED    Comment: (NOTE) The Coronavirus on the Respiratory Panel, DOES NOT test for the novel  Coronavirus (2019 nCoV)    Coronavirus HKU1 NOT DETECTED NOT DETECTED   Coronavirus NL63 NOT DETECTED NOT DETECTED   Coronavirus OC43 NOT DETECTED NOT DETECTED   Metapneumovirus NOT DETECTED NOT DETECTED   Rhinovirus / Enterovirus NOT DETECTED NOT DETECTED   Influenza A NOT DETECTED NOT DETECTED   Influenza B NOT DETECTED NOT DETECTED   Parainfluenza Virus 1 NOT DETECTED NOT DETECTED   Parainfluenza Virus 2 NOT DETECTED NOT DETECTED   Parainfluenza Virus 3 NOT DETECTED NOT DETECTED   Parainfluenza Virus 4 NOT DETECTED NOT DETECTED   Respiratory Syncytial Virus NOT DETECTED NOT DETECTED   Bordetella pertussis NOT DETECTED NOT DETECTED   Chlamydophila pneumoniae NOT DETECTED NOT DETECTED   Mycoplasma pneumoniae NOT DETECTED NOT DETECTED    Comment: Performed at [facility], [place] [address] [place], [place] [address]  Influenza panel by PCR (type A & B)     Status: None   Collection Time: 03/11/18 11:44 AM  Result Value Ref Range   Influenza A By PCR NEGATIVE NEGATIVE   Influenza B By PCR NEGATIVE NEGATIVE    Comment: (NOTE) The Xpert Xpress Flu assay is intended as an aid in the diagnosis of  influenza and should not be used as a sole basis for treatment.  This  assay is FDA approved for nasopharyngeal swab specimens only. Nasal  washings and aspirates are  unacceptable for Xpert Xpress Flu testing. Performed at [facility], [address], [place], [place] [address]   Lactic acid, plasma  Status: None   Collection Time: 03/11/18  2:42 PM  Result Value Ref Range   Lactic Acid, Venous 1.9 0.5 - 1.9 mmol/L    Comment: Performed at [facility], [address], [place], [place] [address]  Glucose, capillary     Status: Abnormal   Collection Time: 03/11/18  4:28 PM  Result Value Ref Range   Glucose-Capillary 66 (L) 70 - 99 mg/dL  Glucose, capillary     Status: None   Collection Time: 03/11/18  5:49 PM  Result Value Ref Range   Glucose-Capillary 93 70 - 99 mg/dL  Blood gas, arterial     Status: Abnormal   Collection Time: 03/12/18  4:45 AM  Result Value Ref Range   FIO2 100.00    Delivery systems VENTILATOR    Mode PRESSURE REGULATED VOLUME CONTROL    VT 620 mL   LHR 16 resp/min   Peep/cpap 5.0 cm H20   pH, Arterial 7.320 (L) 7.350 - 7.450   pCO2 arterial 48.6 (H) 32.0 - 48.0 mmHg   pO2, Arterial 93.0 83.0 - 108.0 mmHg   Bicarbonate 23.0 20.0 - 28.0 mmol/L   Acid-base deficit 1.0 0.0 - 2.0 mmol/L   O2 Saturation 95.7 %   Patient temperature 37.5    Collection site RIGHT RADIAL    Drawn by [id number]    Allens test (pass/fail) PASS PASS    Comment: Performed at [facility], [address] [place], [place] [address]  Vancomycin, peak     Status: Abnormal   Collection Time: 03/12/18  4:46 AM  Result Value Ref Range   Vancomycin Pk 56 (HH) 30 - 40 ug/mL    Comment: CRITICAL RESULT CALLED TO, READ BACK BY AND VERIFIED WITH: HEARN,J AT 5:30AM ON 03/12/18 BY [facility] Performed at [facility], [address], [place], [place] [address]   Protime-INR     Status: Abnormal   Collection Time: 03/12/18  4:46 AM  Result Value Ref Range   Prothrombin Time 19.4 (H) 11.4 - 15.2 seconds   INR 1.7 (H) 0.8 - 1.2    Comment: (NOTE) INR goal varies based on device and disease states. Performed at [facility], [address], [place],  [place] [address]   Comprehensive metabolic panel     Status: Abnormal   Collection Time: 03/12/18  4:46 AM  Result Value Ref Range   Sodium 133 (L) 135 - 145 mmol/L   Potassium 4.0 3.5 - 5.1 mmol/L   Chloride 99 98 - 111 mmol/L   CO2 22 22 - 32 mmol/L   Glucose, Bld 50 (L) 70 - 99 mg/dL   BUN 32 (H) 6 - 20 mg/dL   Creatinine, Ser 3.19 (H) 0.61 - 1.24 mg/dL    Comment: DELTA CHECK NOTED   Calcium 8.2 (L) 8.9 - 10.3 mg/dL   Total Protein 6.0 (L) 6.5 - 8.1 g/dL   Albumin 2.9 (L) 3.5 - 5.0 g/dL   AST 175 (H) 15 - 41 U/L   ALT 77 (H) 0 - 44 U/L   Alkaline Phosphatase 56 38 - 126 U/L   Total Bilirubin 20.5 (HH) 0.3 - 1.2 mg/dL    Comment: CRITICAL RESULT CALLED TO, READ BACK BY AND VERIFIED WITH: HEARN,J AT 5:45AM ON 03/12/18 BY FESTERMAN,C    GFR calc non Af Amer 23 (L) >60 mL/min   GFR calc Af Amer 27 (L) >60 mL/min   Anion gap 12 5 - 15    Comment: Performed at [facility], [address] [place], [place] [address]  CBC     Status: Abnormal   Collection Time: 03/12/18  4:46 AM  Result Value Ref Range   WBC 11.1 (H) 4.0 - 10.5 K/uL   RBC 2.34 (L) 4.22 - 5.81 MIL/uL   Hemoglobin 8.5 (L) 13.0 - 17.0 g/dL   HCT 24.9 (L) 39.0 - 52.0 %   MCV 106.4 (H) 80.0 - 100.0 fL   MCH 36.3 (H) 26.0 - 34.0 pg   MCHC 34.1 30.0 - 36.0 g/dL   RDW 17.4 (H) 11.5 - 15.5 %   Platelets 96 (L) 150 - 400 K/uL    Comment: SPECIMEN CHECKED FOR CLOTS Immature Platelet Fraction may be clinically indicated, consider ordering this additional test [id number] CONSISTENT WITH PREVIOUS RESULT    nRBC 0.0 0.0 - 0.2 %    Comment: Performed at [facility], [address], [place], [place] [address]  Magnesium     Status: None   Collection Time: 03/12/18  4:46 AM  Result Value Ref Range   Magnesium 1.9 1.7 - 2.4 mg/dL    Comment: Performed at [facility], [address], [place], [place] [address]  Lipase, blood     Status: Abnormal   Collection Time: 03/12/18  4:46 AM  Result Value Ref Range   Lipase 52 (H) 11 -  51 U/L    Comment: Performed at [facility], [address], [place], [place] [address]  Lactic acid, plasma     Status: Abnormal   Collection Time: 03/12/18  4:46 AM  Result Value Ref Range   Lactic Acid, Venous 2.5 (HH) 0.5 - 1.9 mmol/L    Comment: CRITICAL RESULT CALLED TO, READ BACK BY AND VERIFIED WITH: NEILSON,T AT 5:25AM ON 03/12/18 BY [facility] Performed at [facility], [address], [place], [place] [address]     ABGS Recent Labs    03/12/18 0445  PHART 7.320*  PO2ART 93.0  HCO3 23.0   CULTURES Recent Results (from the past 240 hour(s))  MRSA PCR Screening     Status: Abnormal   Collection Time: 03/05/18 10:43 PM  Result Value Ref Range Status   MRSA by PCR POSITIVE (A) NEGATIVE Final    Comment:        The GeneXpert MRSA Assay (FDA approved for NASAL specimens only), is one component of a comprehensive MRSA colonization surveillance program. It is not intended to diagnose MRSA infection nor to guide or monitor treatment for MRSA infections. RESULT CALLED TO, READ BACK BY AND VERIFIED WITH: R WAGONER,RN '@[id number]'$  03/06/18 Performed at [facility], [address], [place], [place] [address]   Respiratory Panel by PCR     Status: None   Collection Time: 03/11/18 11:44 AM  Result Value Ref Range Status   Adenovirus NOT DETECTED NOT DETECTED Final   Coronavirus 229E NOT DETECTED NOT DETECTED Final    Comment: (NOTE) The Coronavirus on the Respiratory Panel, DOES NOT test for the novel  Coronavirus (2019 nCoV)    Coronavirus HKU1 NOT DETECTED NOT DETECTED Final   Coronavirus NL63 NOT DETECTED NOT DETECTED Final   Coronavirus OC43 NOT DETECTED NOT DETECTED Final   Metapneumovirus NOT DETECTED NOT DETECTED Final   Rhinovirus / Enterovirus NOT DETECTED NOT DETECTED Final   Influenza A NOT DETECTED NOT DETECTED Final   Influenza B NOT DETECTED NOT DETECTED Final   Parainfluenza Virus 1 NOT DETECTED NOT DETECTED Final   Parainfluenza Virus 2 NOT DETECTED NOT  DETECTED Final   Parainfluenza Virus 3  NOT DETECTED NOT DETECTED Final   Parainfluenza Virus 4 NOT DETECTED NOT DETECTED Final   Respiratory Syncytial Virus NOT DETECTED NOT DETECTED Final   Bordetella pertussis NOT DETECTED NOT DETECTED Final   Chlamydophila pneumoniae NOT DETECTED NOT DETECTED Final   Mycoplasma pneumoniae NOT DETECTED NOT DETECTED Final    Comment: Performed at [facility], [place] [address], [place], [place] [address]   Studies/Results: Portable Chest Xray  Result Date: 03/12/2018 CLINICAL DATA:  Respiratory failure. EXAM: PORTABLE CHEST 1 VIEW COMPARISON:  March 11, 2018 FINDINGS: The heart size and mediastinal contours are stable. Endotracheal tube is identified distal tip 3 cm from carina. Nasogastric tube is identified distal tip not included but is at least in the stomach. Diffuse opacities are identified throughout bilateral lungs which may be slightly worsened compared to prior exam the visualized skeletal structures are stable. IMPRESSION: Diffuse airspace opacity throughout bilateral lungs, slightly worsened compared to prior exam. Electronically Signed   By: Abelardo Diesel M.D.   On: 03/12/2018 07:05   Dg Chest Port 1 View  Result Date: 03/11/2018 CLINICAL DATA:  38 year old male with respiratory failure, liver failure. EXAM: PORTABLE CHEST 1 VIEW COMPARISON:  03/09/2018 FINDINGS: Portable AP view at 0803 hours. Very low lung volumes. Confluent and reticulonodular bilateral pulmonary opacity obscuring some of the mediastinum and diaphragm. Similar lung opacity since 02/29/2021 allowing for lower lung volumes. Visualized tracheal air column is within normal limits. Paucity of bowel gas in the upper abdomen. No acute osseous abnormality identified. IMPRESSION: Very low lung volumes with widespread left greater than right nonspecific pulmonary opacity. Top differential considerations include asymmetric pulmonary edema, infection. Electronically Signed   By: Genevie Ann M.D.    On: 03/11/2018 08:31   Dg Chest Port 1v Same Day  Result Date: 03/11/2018 CLINICAL DATA:  Endotracheal tube placement EXAM: PORTABLE CHEST 1 VIEW COMPARISON:  03/11/2018 FINDINGS: Endotracheal tube is in place, tip approximately 4.1 centimeters above the carina. Nasogastric tube is in place, tip at least the level of the distal stomach but not well seen. Heart size is obscured by significant pulmonary opacities. Diffuse airspace filling opacities throughout the lungs bilaterally, with increasing density. IMPRESSION: 1. Increasing bilateral airspace filling opacities. 2. Endotracheal tube is in place, tip approximately 4.1 centimeters above the carina. Electronically Signed   By: Nolon Nations M.D.   On: 03/11/2018 09:50    Medications:  Prior to Admission:  Medications Prior to Admission  Medication Sig Dispense Refill Last Dose  . acetaminophen (TYLENOL) 500 MG tablet Take 500-1,000 mg by mouth daily as needed for headache.   Past Week at Unknown time  . citalopram (CELEXA) 40 MG tablet Take 40 mg by mouth daily.   03/05/2018 at Unknown time  . lisinopril (PRINIVIL,ZESTRIL) 20 MG tablet Take 20 mg by mouth daily.   Past Week at Unknown time  . LORazepam (ATIVAN) 1 MG tablet Take 1 mg by mouth daily as needed for anxiety.    Past Week at Unknown time  . methadone (DOLOPHINE) 10 MG/ML solution Take 50 mg by mouth every morning.   03/05/2018 at Unknown time  . Vitamin D, Ergocalciferol, (DRISDOL) 1.25 MG (50000 UT) CAPS capsule Take 1 capsule by mouth every Sunday.    03/03/2018 at Unknown time   Scheduled: . benzonatate  100 mg Oral TID  . chlorhexidine gluconate (MEDLINE KIT)  15 mL Mouth Rinse BID  . citalopram  20 mg Oral Daily  . folic acid  1 mg Oral  Daily  . ipratropium  0.5 mg Nebulization Q6H  . levalbuterol  0.63 mg Nebulization Q6H  . mouth rinse  15 mL Mouth Rinse 10 times per day  . methadone  50 mg Oral Daily  . multivitamin with minerals  1 tablet Oral Daily  . pantoprazole   40 mg Oral BID  . polyethylene glycol  17 g Oral Daily  . thiamine  100 mg Oral Daily   Continuous: . ceFEPime (MAXIPIME) IV 2 g (03/12/18 0543)  . levofloxacin (LEVAQUIN) IV Stopped (03/11/18 1720)  . midazolam 0.5 mg/hr (03/12/18 0258)  . phenylephrine (NEO-SYNEPHRINE) Adult infusion 20 mcg/min (03/12/18 0435)  . propofol (DIPRIVAN) infusion Stopped (03/11/18 1220)  . vancomycin (VANCOCIN) 1500 mg/518m IVPB (Vial-Mate [employer]or) 1,500 mg (03/12/18 0108)   PZHY:QMVHQION(SUBLIMAZE) injection, fentaNYL (SUBLIMAZE) injection, hydrALAZINE, ondansetron **OR** ondansetron (ZOFRAN) IV  Assesment: His problem started with what appeared to be alcoholic hepatitis.  He had hyponatremia hyperbilirubinemia hypoalbuminemia elevated liver function testing.  He was treated conservatively and was improving when on 2/29 he had fairly sudden onset of difficulty with oxygenation.  He was noted to have left lung infiltrate.  There was no overt aspiration he had been alert but perhaps slightly confused with presumed hepatic encephalopathy.  His problems with oxygenation continued to the point that even with nonrebreather mask and high flow nasal cannula his oxygenation was marginal and he was at that point intubated and started on mechanical ventilation.  Chest x-ray yesterday showed diffuse bilateral infiltrates.  I personally reviewed the film.  Chest x-ray today looks much worse  Over the last 24 hours he is developed increased lactic acid level, hypotension requiring pressor support worsened liver function increased bilirubin and fever.  He has much worsened renal function.  He has liver failure from alcoholic cirrhosis with previously elevated ammonia level elevated LFTs and elevated bilirubin.  He is going to need nutritional support but I do not think we can safely do it through a gastric tube without knowing if he has varices  IV access has been something of a problem and PICC line is planned but has not  been placed yet  He has multisystem failure and is critically ill.  He probably has ARDS presumably from an infectious source.  He did receive some albumin yesterday and his albumin level is better  Respiratory pathogen panel is negative.  There was concern about novel coronavirus but he was not felt to be high risk after discussion with infectious disease and testing was not done   Active Problems:   Hyponatremia   Ascites due to alcoholic hepatitis   Cough   Hyperbilirubinemia   Hypoalbuminemia    [facility]  Check sputum and blood cultures.  He will likely need to be transition to ARDS protocol for the ventilator which I will do if his transfer is going to be delayed.  Otherwise I will hold off to avoid making changes that might make him  unstable    LOS: 7 days   EAlonza Bogus3/03/2018, 7:27 AM

## 2018-03-12 NOTE — Progress Notes (Signed)
Peripherally Inserted Central Catheter/Midline Placement  The IV Nurse has discussed with the patient and/or persons authorized to consent for the patient, the purpose of this procedure and the potential benefits and risks involved with this procedure.  The benefits include less needle sticks, lab draws from the catheter, and the patient may be discharged home with the catheter. Risks include, but not limited to, infection, bleeding, blood clot (thrombus formation), and puncture of an artery; nerve damage and irregular heartbeat and possibility to perform a PICC exchange if needed/ordered by physician.  Alternatives to this procedure were also discussed.  Bard Power PICC patient education guide, fact sheet on infection prevention and patient information card has been provided to patient /or left at bedside.    PICC/Midline Placement Documentation  PICC Triple Lumen 03/12/18 PICC Right Brachial 43 cm 0 cm (Active)  Indication for Insertion or Continuance of Line Vasoactive infusions 03/12/2018  9:00 AM  Exposed Catheter (cm) 4 cm 03/12/2018  9:00 AM  Site Assessment Clean;Dry;Intact 03/12/2018  9:00 AM  Lumen #1 Status Flushed;Blood return noted 03/12/2018  9:00 AM  Lumen #2 Status Flushed;Blood return noted 03/12/2018  9:00 AM  Lumen #3 Status Flushed;Blood return noted 03/12/2018  9:00 AM  Dressing Type Gauze 03/12/2018  9:00 AM  Dressing Status Clean;Dry;Intact 03/12/2018  9:00 AM  Dressing Change Due 03/19/18 03/12/2018  9:00 AM       Stacie Glaze [place] 03/12/2018, 9:37 AM

## 2018-03-12 NOTE — Progress Notes (Signed)
PROGRESS NOTE    Charles Hebert  FTD:[id number] DOB: May 06, 1980 DOA: 03/05/2018 PCP: Vidal Schwalbe, MD   Brief Narrative:  Per HPI:  Charles Hebert a 38 y.o.malewith medical history significant foralcohol abuse,HTN,was sent to the ED by his primary care provider after blood work showed low sodium.Patient reportsnoticing skin color change about 2 weeks ago,reports vomiting blood last episode this morning 3 times, black stools and loosestoolsover the past month.Averages 3-4 stools daily. Reports poor p.o. intake and fatigue over the past month. Reports gradualabdominal swellingwithtightness over the past 3 months,without leg swelling, no difficulty breathing. He reports abdominal pain right upper quadrant and epigastric area. No chest pain, no difficulty breathing. No seizures, no confusion. Patient lives with his father who is present at bedside.  Patient admits to drinking at least 10 beers every day.He tells me his last drink was yesterday- 1 beer,because he is trying to cut down. He started drinking at the age of 89. He drinks to avoid going into withdrawal. Has been on methadone since 2015,goes to a pain clinic in [place] treatment center. DeniesIV drug use.  Clinical Course:  Patient was admitted on 2/25 for decompensated alcoholic liver cirrhosis and was started on IV methylprednisolone and was gradually transitioned to oral prednisone by GI who is following in consultation.  He was noted to stabilize from a liver standpoint and was actually doing better with anticipated discharge in 24 to 48 hours due to his improvement.  Unfortunately, on 2/29, patient was noted to become significantly hypoxemic and was on 8 L nasal cannula, whereas he was on 1-2L prior.  Chest x-ray was obtained demonstrating what appeared to be findings of HCAP and he was empirically started on vancomycin and cefepime.  His hypoxemia continued to worsen and he required  use of BiPAP which he would not tolerate.  On 3/2, repeat chest x-ray was obtained demonstrating worsening bilateral infiltrate and pulmonology was consulted for further evaluation.  At this time, it appeared necessary that the patient would require intubation, therefore he was intubated and placed on mechanical ventilation.  There was initial suspicion the patient may have infection with coronavirus and therefore he was placed in isolation in a negative pressure room.  His respiratory panel and flu swabs have returned negative at this point in time and his clinical condition continues to worsen with worsening infiltrates noted on repeat chest x-ray on 3/3.  ID was consulted regarding coronavirus testing, but they did not deem this necessary at the time and isolation precautions were discontinued.  A PICC line order was placed yesterday due to anticipated need for pressor use, but has not been placed as of yet and is pending.  He has required use of Neo-Synephrine overnight due to hypotension and mean arterial pressure in the 50-60 range.  Patient continues to have worsening renal function with elevated creatinine level and little to no urine output.  He is also developing hypoactive bowel sounds this morning.  He is noted to have increasing lactic acid level despite 30 cc/kg fluid bolus last night and is still with elevated INR as well as elevated bilirubin level.  GI has evaluated patient yesterday and was noted to have a MELD score of 29.  Intensivist service was called at [facility] who have agreed to see the patient in transfer for further evaluation and management in an intensive care setting.   Assessment & Plan:   Active Problems:   Hyponatremia   Ascites due to alcoholic hepatitis   Cough  Hyperbilirubinemia   Hypoalbuminemia  1. Decompensated alcoholic liver cirrhosis with jaundice on presentation. Patient has a poor prognosis with meld score of 36 on admission and now at 29.Currently  awaiting quantitative hepatitis C result. Bilirubin continues to remain elevated as well as liver enzymes.Continue onIV Protonix. Steroids discontinued earlier on account of development of HCAP. INRappears to remain elevated.He did receive albumin infusions yesterday and levels are improved. Will need to follow-up with GI upon transfer. 2. Septic shock secondary to HCAP with associated ALI/ARDS. Continue on vancomycin and cefepime along with addition of Levaquin yesterday. Maintain on mechanical ventilation with further care per intensivist service at [facility].  Follow-up lactic acid in a.m. Flu swab and respiratory panel negative. ID called regarding coronavirus testing, but stated that further testing was not needed at this time and removed isolation precautions. 3. Acute hypoxemic respiratory failure- related to above.  Further care per intensivist team at [facility] with ventilator weaning as tolerated. 4. 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 Sodium levels remain low, but stable.  Will maintain on IV normal saline with follow-up labs in a.m. 5. AKI- worsening with oliguria.  Nephrotoxic agents have been withheld, but this is related to above processes.  Continue IV fluid hydration for now and monitor strict I's and O's as well as daily weights.  Renal panel in a.m. 6. History of alcohol abuse. Patient is a high risk for withdrawal symptoms. Maintain on CIWA precautions. He was noted to be intoxicated on admission.  He has not had any significant periods of agitation/anxiety while here. 7. Chronic narcotic use with methadone. Patient is currently on Versed drip.  This will likely linger in his system for several days.  Will not place on any IV narcotics at this time.  He will need to be monitored for withdrawal symptoms from this as his clinical condition continues to progress. 8. Hypertension with tachycardia. Blood pressure is very low with low mean arterial pressures for which  Neo-Synephrine has been added for support overnight.  PICC line placement is currently pending. 9. Hypomagnesemia.    Repleted and improved this morning.  Will recheck in a.m.   DVT prophylaxis:SCDs Code Status:Full Family Communication:Brother at bedside this morning.  Spoke with family members to include parents extensively yesterday. Disposition Plan:Transfer to [facility], ICU.  Spoke with Dr. James Ivanoff in the phone and accepting physician will be Dr. Halford Chessman.  Continue on current antibiotics as well as IV normal saline and pressor support as well as mechanical ventilation.  Overall condition is critical and prognosis is poor.   Consultants:  GI  Pulmonology  ID on phone  Procedures:  None  Antimicrobials:  Rocephin 2/25-2/29  Vancomycin and cefepime 2/29->  Levaquin 3/2->  Subjective: Patient seen and evaluated today with worsening blood pressure readings overnight and was started on Neo-Synephrine for support.  He has had little to no urine output overnight and now has diminished bowel sounds.  He appears to be at his baseline level of mentation, despite sedation on the ventilator.  PICC line was ordered yesterday, but has not been placed as of yet.  No other acute concerns or events noted overnight.  Objective: Vitals:   03/12/18 0600 03/12/18 0630 03/12/18 0633 03/12/18 0648  BP: (!) 97/43 (!) 97/41 (!) 99/44   Pulse: (!) 109 (!) 109 (!) 108 (!) 109  Resp: (!) 21 (!) 22 (!) 23 (!) 26  Temp:    98 F (36.7 C)  TempSrc:    Axillary  SpO2: 93%  92% 92% 93%  Weight:      Height:        Intake/Output Summary (Last 24 hours) at 03/12/2018 0716 Last data filed at 03/12/2018 0600 Gross per 24 hour  Intake 4047.68 ml  Output 225 ml  Net 3822.68 ml   Filed Weights   03/08/18 0500 03/11/18 0251 03/12/18 0500  Weight: 97.6 kg 96.2 kg 100 kg    Examination:  General exam: Appears calm and comfortable  Respiratory system: Diminished bilaterally, patient  currently intubated on mechanical ventilator FiO2 100%. Cardiovascular system: S1 & S2 heard, RRR. No JVD, murmurs, rubs, gallops or clicks. No pedal edema. Mildly tachycardic Gastrointestinal system: Abdomen is distended, soft and nontender. No organomegaly or masses felt. Hypoactive bowel sounds. Central nervous system: Alert and awake. Extremities: Symmetric 5 x 5 power. Skin: No rashes, lesions or ulcers. Mildly jaundiced. Psychiatry: Cannot be appropriately assessed at this time.    Data Reviewed: I have personally reviewed following labs and imaging studies  CBC: Recent Labs  Lab 03/07/18 0259 03/08/18 0423 03/09/18 0433 03/11/18 0412 03/12/18 0446  WBC 13.9* 14.3* 13.2* 13.3* 11.1*  HGB 10.0* 9.4* 9.4* 9.7* 8.5*  HCT 27.7* 26.7* 27.7* 28.0* 24.9*  MCV 98.9 104.3* 105.3* 103.7* 106.4*  PLT 99* 94* 103* 107* 96*   Basic Metabolic Panel: Recent Labs  Lab 03/05/18 1610  03/08/18 0423 03/09/18 0433 03/10/18 0411 03/11/18 0412 03/12/18 0446  NA 115*   < > 131* 130* 132* 131* 133*  K 4.7   < > 5.1 5.0 4.3 3.6 4.0  CL 80*   < > 100 99 96* 93* 99  CO2 22   < > '24 26 28 28 22  '$ GLUCOSE 108*   < > 136* 107* 101* 88 50*  BUN 27*   < > 22* 18 18 22* 32*  CREATININE 3.02*   < > 1.29* 1.04 1.07 1.50* 3.19*  CALCIUM 8.5*   < > 8.6* 8.8* 8.9 8.1* 8.2*  MG 2.2  --   --   --   --  1.5* 1.9  PHOS 5.6*  --   --   --   --   --   --    < > = values in this interval not displayed.   GFR: Estimated Creatinine Clearance: 38.5 mL/min (A) (by C-G formula based on SCr of 3.19 mg/dL (H)). Liver Function Tests: Recent Labs  Lab 03/08/18 0423 03/09/18 0433 03/10/18 0411 03/11/18 0412 03/12/18 0446  AST 141* 146* 153* 166* 175*  ALT 72* 80* 88* 89* 77*  ALKPHOS 89 87 82 72 56  BILITOT 18.6* 17.4* 18.1* 18.7* 20.5*  PROT 6.8 6.5 6.4* 6.1* 6.0*  ALBUMIN 2.6* 2.5* 2.4* 2.2* 2.9*   Recent Labs  Lab 03/12/18 0446  LIPASE 52*   Recent Labs  Lab 03/11/18 0902  AMMONIA 62*    Coagulation Profile: Recent Labs  Lab 03/08/18 0423 03/09/18 0433 03/10/18 0411 03/11/18 0412 03/12/18 0446  INR 1.5* 1.6* 1.6* 1.5* 1.7*   Cardiac Enzymes: No results for input(s): CKTOTAL, CKMB, CKMBINDEX, TROPONINI in the last 168 hours. BNP (last 3 results) No results for input(s): PROBNP in the last 8760 hours. HbA1C: No results for input(s): HGBA1C in the last 72 hours. CBG: Recent Labs  Lab 03/11/18 1628 03/11/18 1749  GLUCAP 66* 93   Lipid Profile: Recent Labs    03/11/18 0902  TRIG 159*   Thyroid Function Tests: No results for input(s): TSH, T4TOTAL, FREET4, T3FREE, THYROIDAB in the last  72 hours. Anemia Panel: No results for input(s): VITAMINB12, FOLATE, FERRITIN, TIBC, IRON, RETICCTPCT in the last 72 hours. Sepsis Labs: Recent Labs  Lab 03/11/18 1442 03/12/18 0446  LATICACIDVEN 1.9 2.5*    Recent Results (from the past 240 hour(s))  MRSA PCR Screening     Status: Abnormal   Collection Time: 03/05/18 10:43 PM  Result Value Ref Range Status   MRSA by PCR POSITIVE (A) NEGATIVE Final    Comment:        The GeneXpert MRSA Assay (FDA approved for NASAL specimens only), is one component of a comprehensive MRSA colonization surveillance program. It is not intended to diagnose MRSA infection nor to guide or monitor treatment for MRSA infections. RESULT CALLED TO, READ BACK BY AND VERIFIED WITH: R WAGONER,RN '@[id number]'$  03/06/18 Performed at [facility], [address], [place], [place] [address]   Respiratory Panel by PCR     Status: None   Collection Time: 03/11/18 11:44 AM  Result Value Ref Range Status   Adenovirus NOT DETECTED NOT DETECTED Final   Coronavirus 229E NOT DETECTED NOT DETECTED Final    Comment: (NOTE) The Coronavirus on the Respiratory Panel, DOES NOT test for the novel  Coronavirus (2019 nCoV)    Coronavirus HKU1 NOT DETECTED NOT DETECTED Final   Coronavirus NL63 NOT DETECTED NOT DETECTED Final   Coronavirus OC43 NOT DETECTED  NOT DETECTED Final   Metapneumovirus NOT DETECTED NOT DETECTED Final   Rhinovirus / Enterovirus NOT DETECTED NOT DETECTED Final   Influenza A NOT DETECTED NOT DETECTED Final   Influenza B NOT DETECTED NOT DETECTED Final   Parainfluenza Virus 1 NOT DETECTED NOT DETECTED Final   Parainfluenza Virus 2 NOT DETECTED NOT DETECTED Final   Parainfluenza Virus 3 NOT DETECTED NOT DETECTED Final   Parainfluenza Virus 4 NOT DETECTED NOT DETECTED Final   Respiratory Syncytial Virus NOT DETECTED NOT DETECTED Final   Bordetella pertussis NOT DETECTED NOT DETECTED Final   Chlamydophila pneumoniae NOT DETECTED NOT DETECTED Final   Mycoplasma pneumoniae NOT DETECTED NOT DETECTED Final    Comment: Performed at [facility], [place] [address] [place], [place] [address]         Radiology Studies: Portable Chest Xray  Result Date: 03/12/2018 CLINICAL DATA:  Respiratory failure. EXAM: PORTABLE CHEST 1 VIEW COMPARISON:  March 11, 2018 FINDINGS: The heart size and mediastinal contours are stable. Endotracheal tube is identified distal tip 3 cm from carina. Nasogastric tube is identified distal tip not included but is at least in the stomach. Diffuse opacities are identified throughout bilateral lungs which may be slightly worsened compared to prior exam the visualized skeletal structures are stable. IMPRESSION: Diffuse airspace opacity throughout bilateral lungs, slightly worsened compared to prior exam. Electronically Signed   By: Abelardo Diesel M.D.   On: 03/12/2018 07:05   Dg Chest Port 1 View  Result Date: 03/11/2018 CLINICAL DATA:  38 year old male with respiratory failure, liver failure. EXAM: PORTABLE CHEST 1 VIEW COMPARISON:  03/09/2018 FINDINGS: Portable AP view at 0803 hours. Very low lung volumes. Confluent and reticulonodular bilateral pulmonary opacity obscuring some of the mediastinum and diaphragm. Similar lung opacity since 02/29/2021 allowing for lower lung volumes. Visualized tracheal air column  is within normal limits. Paucity of bowel gas in the upper abdomen. No acute osseous abnormality identified. IMPRESSION: Very low lung volumes with widespread left greater than right nonspecific pulmonary opacity. Top differential considerations include asymmetric pulmonary edema, infection. Electronically Signed   By: Herminio Heads.D.  On: 03/11/2018 08:31   Dg Chest Port 1v Same Day  Result Date: 03/11/2018 CLINICAL DATA:  Endotracheal tube placement EXAM: PORTABLE CHEST 1 VIEW COMPARISON:  03/11/2018 FINDINGS: Endotracheal tube is in place, tip approximately 4.1 centimeters above the carina. Nasogastric tube is in place, tip at least the level of the distal stomach but not well seen. Heart size is obscured by significant pulmonary opacities. Diffuse airspace filling opacities throughout the lungs bilaterally, with increasing density. IMPRESSION: 1. Increasing bilateral airspace filling opacities. 2. Endotracheal tube is in place, tip approximately 4.1 centimeters above the carina. Electronically Signed   By: Nolon Nations M.D.   On: 03/11/2018 09:50        Scheduled Meds: . benzonatate  100 mg Oral TID  . chlorhexidine gluconate (MEDLINE KIT)  15 mL Mouth Rinse BID  . citalopram  20 mg Oral Daily  . folic acid  1 mg Oral Daily  . ipratropium  0.5 mg Nebulization Q6H  . levalbuterol  0.63 mg Nebulization Q6H  . mouth rinse  15 mL Mouth Rinse 10 times per day  . methadone  50 mg Oral Daily  . multivitamin with minerals  1 tablet Oral Daily  . pantoprazole  40 mg Oral BID  . polyethylene glycol  17 g Oral Daily  . thiamine  100 mg Oral Daily   Continuous Infusions: . ceFEPime (MAXIPIME) IV 2 g (03/12/18 0543)  . levofloxacin (LEVAQUIN) IV Stopped (03/11/18 1720)  . midazolam 0.5 mg/hr (03/12/18 0258)  . phenylephrine (NEO-SYNEPHRINE) Adult infusion 20 mcg/min (03/12/18 0435)  . propofol (DIPRIVAN) infusion Stopped (03/11/18 1220)  . vancomycin (VANCOCIN) 1500 mg/545m IVPB (Vial-Mate  Adaptor) 1,500 mg (03/12/18 0108)     LOS: 7 days    Critical care time 40 minutes spent on patient this a.m.    [place] Merida DDarleen Crocker DO [facility] Pager [phone] If 7PM-7AM, please contact night-coverage [email or web address] Password [facility]3/03/2018, 7:16 AM

## 2018-03-12 NOTE — Progress Notes (Signed)
Vancomycin trough of 46.  Pharmacy has discontinued vancomycin from profile and ordered repeat vancomycin trough in AM

## 2018-03-12 NOTE — Progress Notes (Signed)
Initial Nutrition Assessment  DOCUMENTATION CODES:  Not applicable  INTERVENTION:  Once hemodynamically stable, recommended initiation of nutrition support. Appropriate regimen based on current parameters is as follows:   Vital AF 1.2 at goal rate of 80 ml/h (1920 ml per day) to provide 2304 kcals, 144 gm protein, 1557 ml free water daily.  Given tenuous state, would recommend initiation at 20 cc/hr and titrating up 10 cc q 4 hrs.   NUTRITION DIAGNOSIS:  Inadequate oral intake related to inability to eat as evidenced by NPO status.  GOAL:  Patient will meet greater than or equal to 90% of their needs  MONITOR:  Diet advancement, Vent status, Labs, I & O's, Skin, TF tolerance, Weight trends  REASON FOR ASSESSMENT:  Ventilator    ASSESSMENT:  38 y/o male PMHx etoh abuse, HTN, HLD. Presented to ED at request of PCP d/t hyponatremia and ascites r/t worsening liver failure. Pt has had n/v/d, fatigue and loss of appetite over past month. Has had progressive abdominal swelling x3 months. On evaluation in ED, found to be severely hyponatremic (115) . Admitted for management of AKI and liver failure.   On 2/29, pt developed acute respiratory failure w/ CXR showing evidence of pna. Respiratory status continued to deteriorate and pt ultimately required intubation 3/2. His liver fx and respiratory status have continued to deteriorate and he has also developed AKI and hypotension, requiring initiation of pressors. Current plan is to transfer to [facility] d/t ARDS/ALI and worsening septic shock.   Pt intubated. He is arousable and will nod his head in response to questions, but do not feel he is alert enough to actually understand what RD is asking. All information from chart/staff.   Per chart, while only minimal information available, it appears pt was eating well prior to decompensating on 2/29; He is documented as eating 75%-100% of meals on 2/28 (hh diet).   He as 213.6 lbs when admitted to aph  2/27. The only wt measurement in chart prior to this encounter was from a visit at [facility] last August; he was documented as being 205 lbs, though this wt was likely reported. His dry wt is likely much less, as he has a profoundly distended abdomen  At this time, pt is not hemodynamically stable and in very tenuous clinical state. Vasopressors had to be titrated up last night and MAP just slightly over 60 mm Hg.  Pt noted as drinking >/= to 10 beers/day with last drink the day PTA.  He would benefit from prompt initiation of nutritional support once he is hemodynamically stable. He is hypoglycemic. Bili >20,   Unclear when pt will be able to be transferred. RN reports his last BM was yesterday  Patient is currently intubated on ventilator support MV: 13.8 L/min Temp (24hrs), Avg:99 F (37.2 C), Min:98 F (36.7 C), Max:99.8 F (37.7 C) BP: 106/49 (63)  Labs: Lactic Acic: 2.4, Glu: 66, 93, 50, 49, 77, Na: 116->to 133, TG:159, Bili>20 (increasing), BUN/creat (32/3.19(increasing) Meds: Folate, MVI with min, methadone, miralax, thiamine, ppi, Albumin:2.9,  Pressor Support: Neosynephrine,  Infusions: IVF (D5), IV ABx, albumin, mag sulf,  Sedation/Analgesia: Versed, PRN fentanyl to maintain RASS  Recent Labs  Lab 03/05/18 1610  03/10/18 0411 03/11/18 0412 03/12/18 0446  NA 115*   < > 132* 131* 133*  K 4.7   < > 4.3 3.6 4.0  CL 80*   < > 96* 93* 99  CO2 22   < > 28 28 22   BUN 27*   < >  18 22* 32*  CREATININE 3.02*   < > 1.07 1.50* 3.19*  CALCIUM 8.5*   < > 8.9 8.1* 8.2*  MG 2.2  --   --  1.5* 1.9  PHOS 5.6*  --   --   --   --   GLUCOSE 108*   < > 101* 88 50*   < > = values in this interval not displayed.   NUTRITION - FOCUSED PHYSICAL EXAM:   Most Recent Value  Orbital Region  Severe depletion  Upper Arm Region  No depletion  Thoracic and Lumbar Region  No depletion  Buccal Region  No depletion  Temple Region  Mild depletion  Clavicle Bone Region  No depletion  Clavicle and  Acromion Bone Region  No depletion  Scapular Bone Region  No depletion  Dorsal Hand  No depletion  Patellar Region  No depletion  Anterior Thigh Region  No depletion  Posterior Calf Region  No depletion  Edema (RD Assessment)  Severe [Ascites?]  Hair  Reviewed  Eyes  Reviewed [Icteric]  Mouth  Reviewed  Skin  Reviewed [Jaundice]  Nails  Reviewed     Diet Order:   Diet Order            Diet NPO time specified  Diet effective now             EDUCATION NEEDS:  No education needs have been identified at this time  Skin:  Skin Assessment: Reviewed RN Assessment  Last BM:  3/2  Height:  Ht Readings from Last 1 Encounters:  03/05/18 6' (1.829 m)   Weight:  Wt Readings from Last 1 Encounters:  03/12/18 100 kg  Initial wt: 97.1 kg  Ideal Body Weight:  80.91 kg  BMI:  Body mass index is 29.9 kg/m.  Estimated Nutritional Needs:  Kcal:  2365 kcals (PSU 2003 B Protein:  145-162g Pro (1.8-2g/kg ibw) Fluid:  Per MD judgement   Christophe Louis RD, LDN, CNSC Clinical Nutrition Available Tues-Sat via Pager: [phone] 03/12/2018 11:58 AM

## 2018-03-13 DIAGNOSIS — F1020 Alcohol dependence, uncomplicated: Secondary | ICD-10-CM

## 2018-03-13 DIAGNOSIS — N289 Disorder of kidney and ureter, unspecified: Secondary | ICD-10-CM

## 2018-03-13 DIAGNOSIS — J9601 Acute respiratory failure with hypoxia: Secondary | ICD-10-CM

## 2018-03-13 DIAGNOSIS — Z978 Presence of other specified devices: Secondary | ICD-10-CM

## 2018-03-14 LAB — CULTURE, RESPIRATORY

## 2018-03-14 LAB — CULTURE, RESPIRATORY W GRAM STAIN
Culture: NORMAL
Special Requests: NORMAL

## 2018-03-17 LAB — CULTURE, BLOOD (ROUTINE X 2)
Culture: NO GROWTH
Culture: NO GROWTH
Special Requests: ADEQUATE
Special Requests: ADEQUATE

## 2018-04-10 DEATH — deceased

## 2020-01-15 IMAGING — CR DG CHEST 1V PORT SAME DAY
2 series · 2 of 2 positions shown · non-contrast
Comparison: 03/11/2018

CLINICAL DATA: Endotracheal tube placement

EXAM:
PORTABLE CHEST 1 VIEW

[ap (1 of 2)]
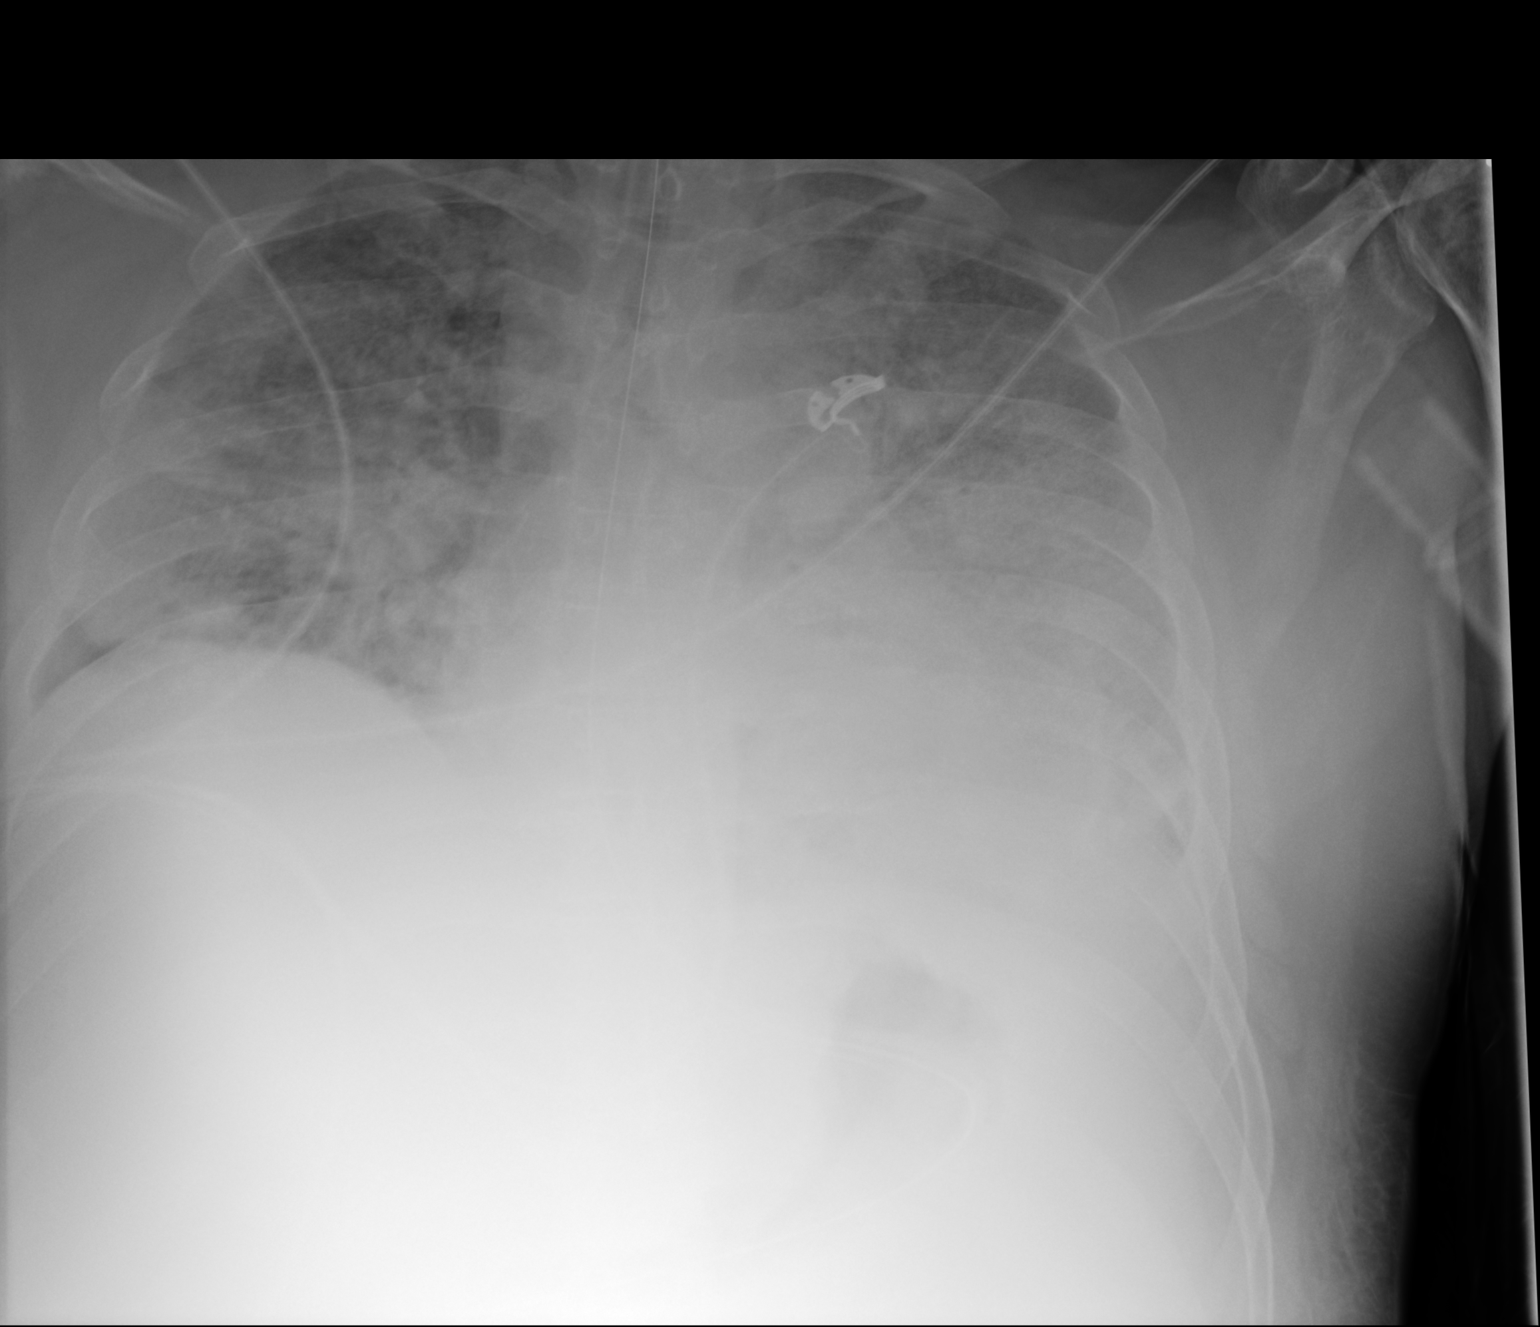

[ap (2 of 2)]
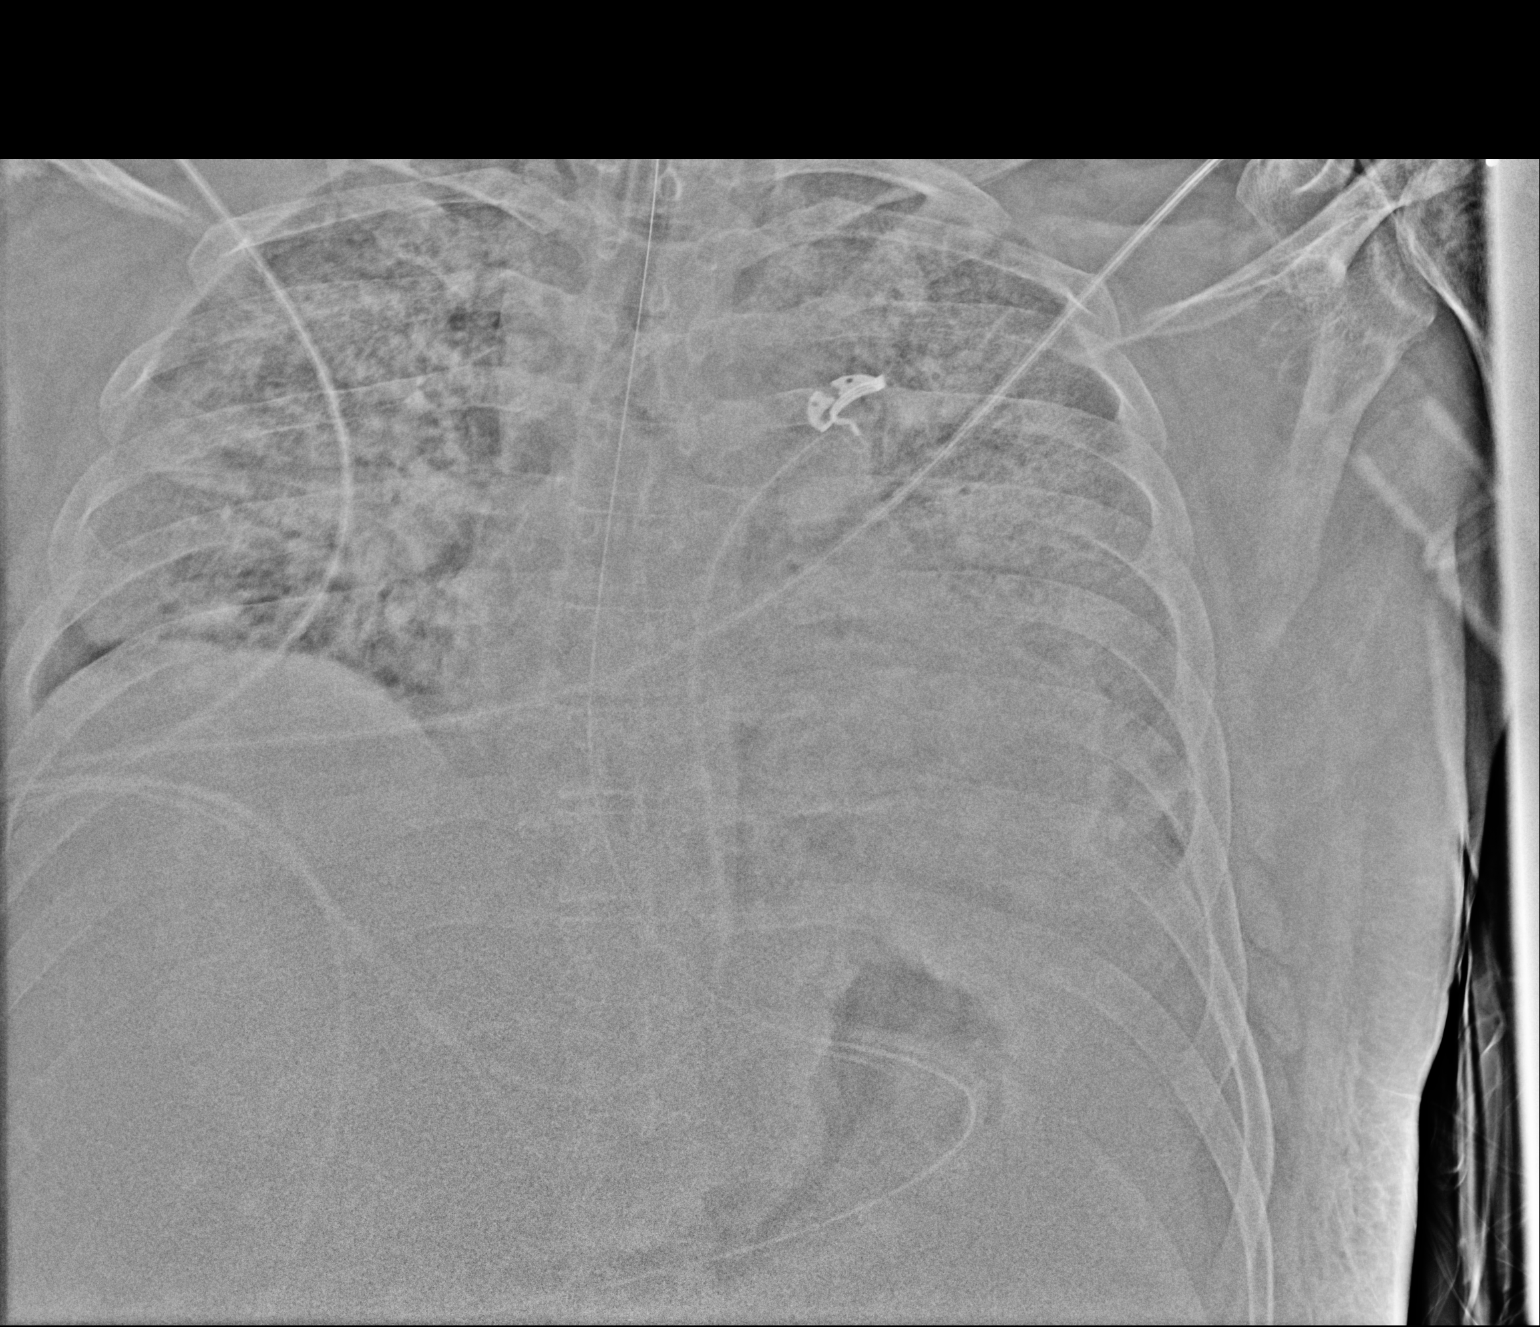

[2 of 2 positions shown; findings below may reference images not displayed]

FINDINGS: Endotracheal tube is in place, tip approximately 4.1 centimeters
above the carina. Nasogastric tube is in place, tip at least the
level of the distal stomach but not well seen.

Heart size is obscured by significant pulmonary opacities. Diffuse
airspace filling opacities throughout the lungs bilaterally, with
increasing density.
IMPRESSION: 1. Increasing bilateral airspace filling opacities.
2. Endotracheal tube is in place, tip approximately 4.1 centimeters
above the carina.

## 2020-01-15 IMAGING — CR DG CHEST 1V PORT
1 series · 1 of 1 positions shown · non-contrast
Comparison: 03/09/2018

CLINICAL DATA: 37-year-old male with respiratory failure, liver
failure.

EXAM:
PORTABLE CHEST 1 VIEW

[portable]
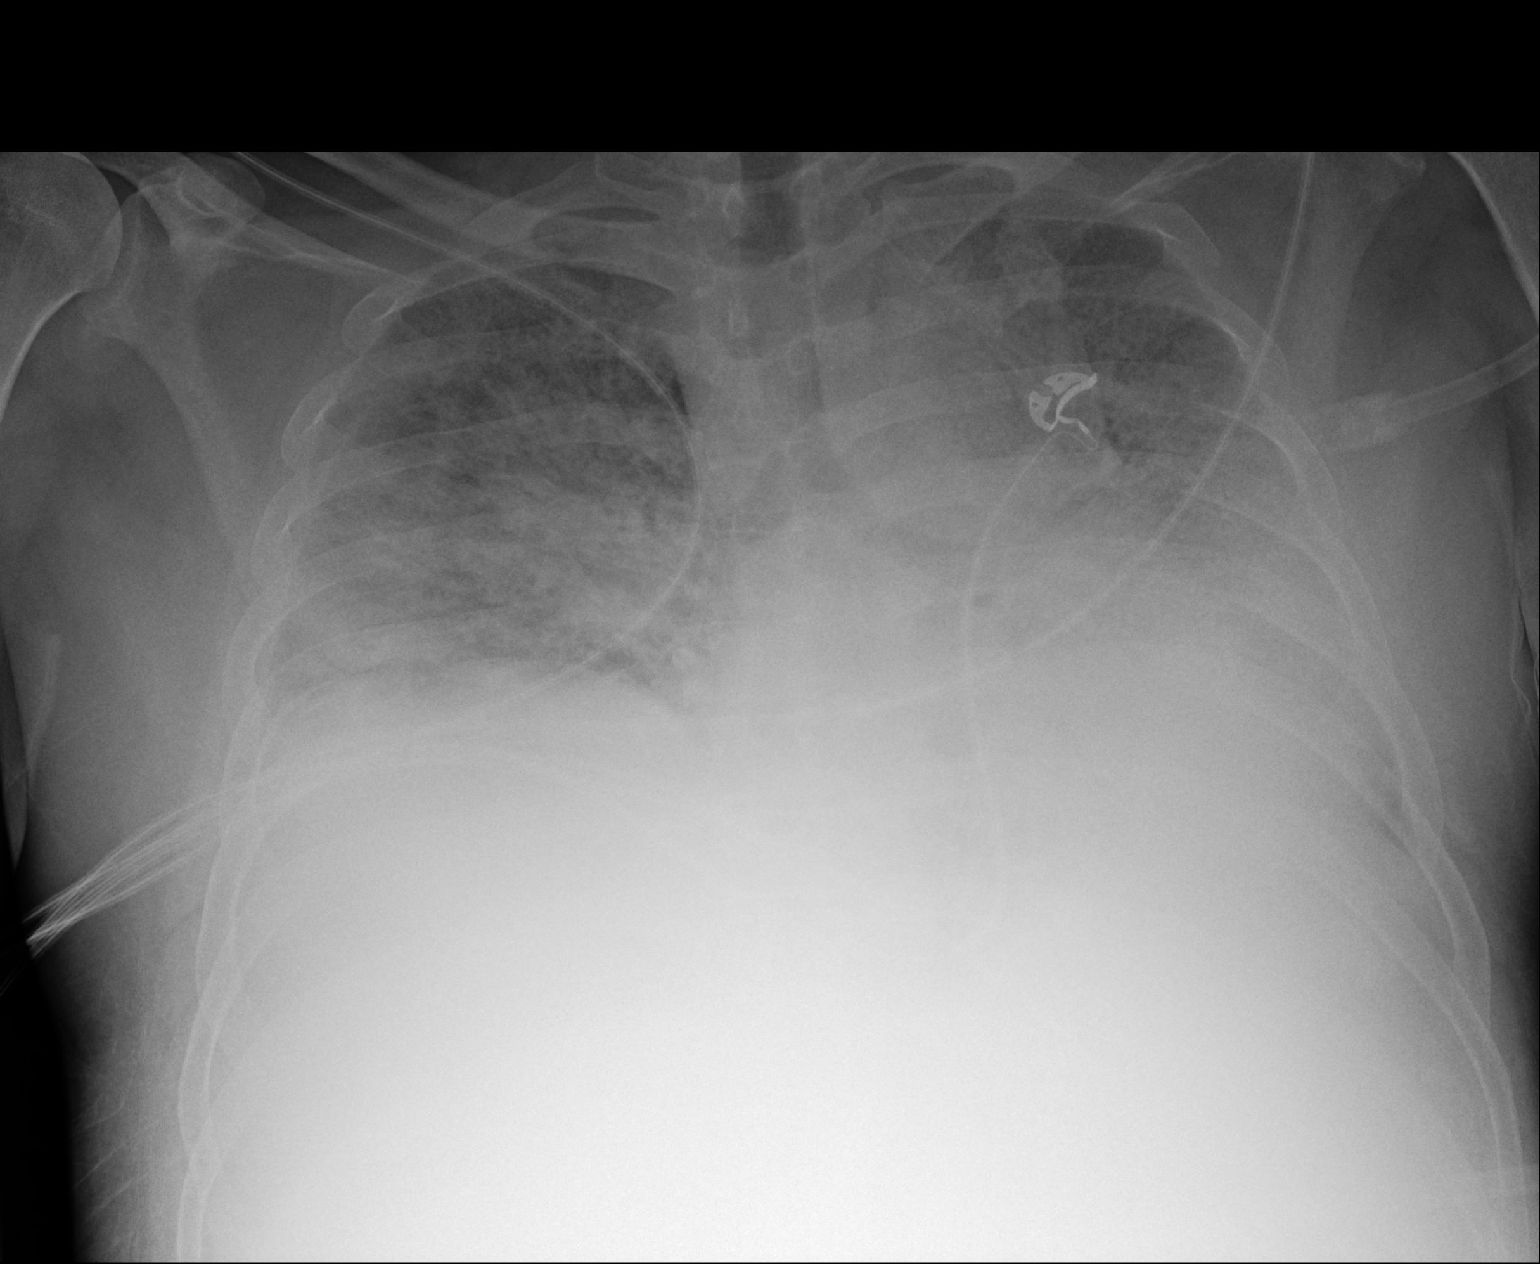

[1 of 1 positions shown; findings below may reference images not displayed]

FINDINGS: Portable AP view at 0104 hours. Very low lung volumes. Confluent and
reticulonodular bilateral pulmonary opacity obscuring some of the
mediastinum and diaphragm. Similar lung opacity since [DATE]
allowing for lower lung volumes. Visualized tracheal air column is
within normal limits. Paucity of bowel gas in the upper abdomen. No
acute osseous abnormality identified.
IMPRESSION: Very low lung volumes with widespread left greater than right
nonspecific pulmonary opacity. Top differential considerations
include asymmetric pulmonary edema, infection.

## 2020-01-16 IMAGING — CR DG CHEST 1V PORT
1 series · 1 of 1 positions shown · non-contrast
Comparison: March 11, 2018

CLINICAL DATA: Respiratory failure.

EXAM:
PORTABLE CHEST 1 VIEW

[portable]
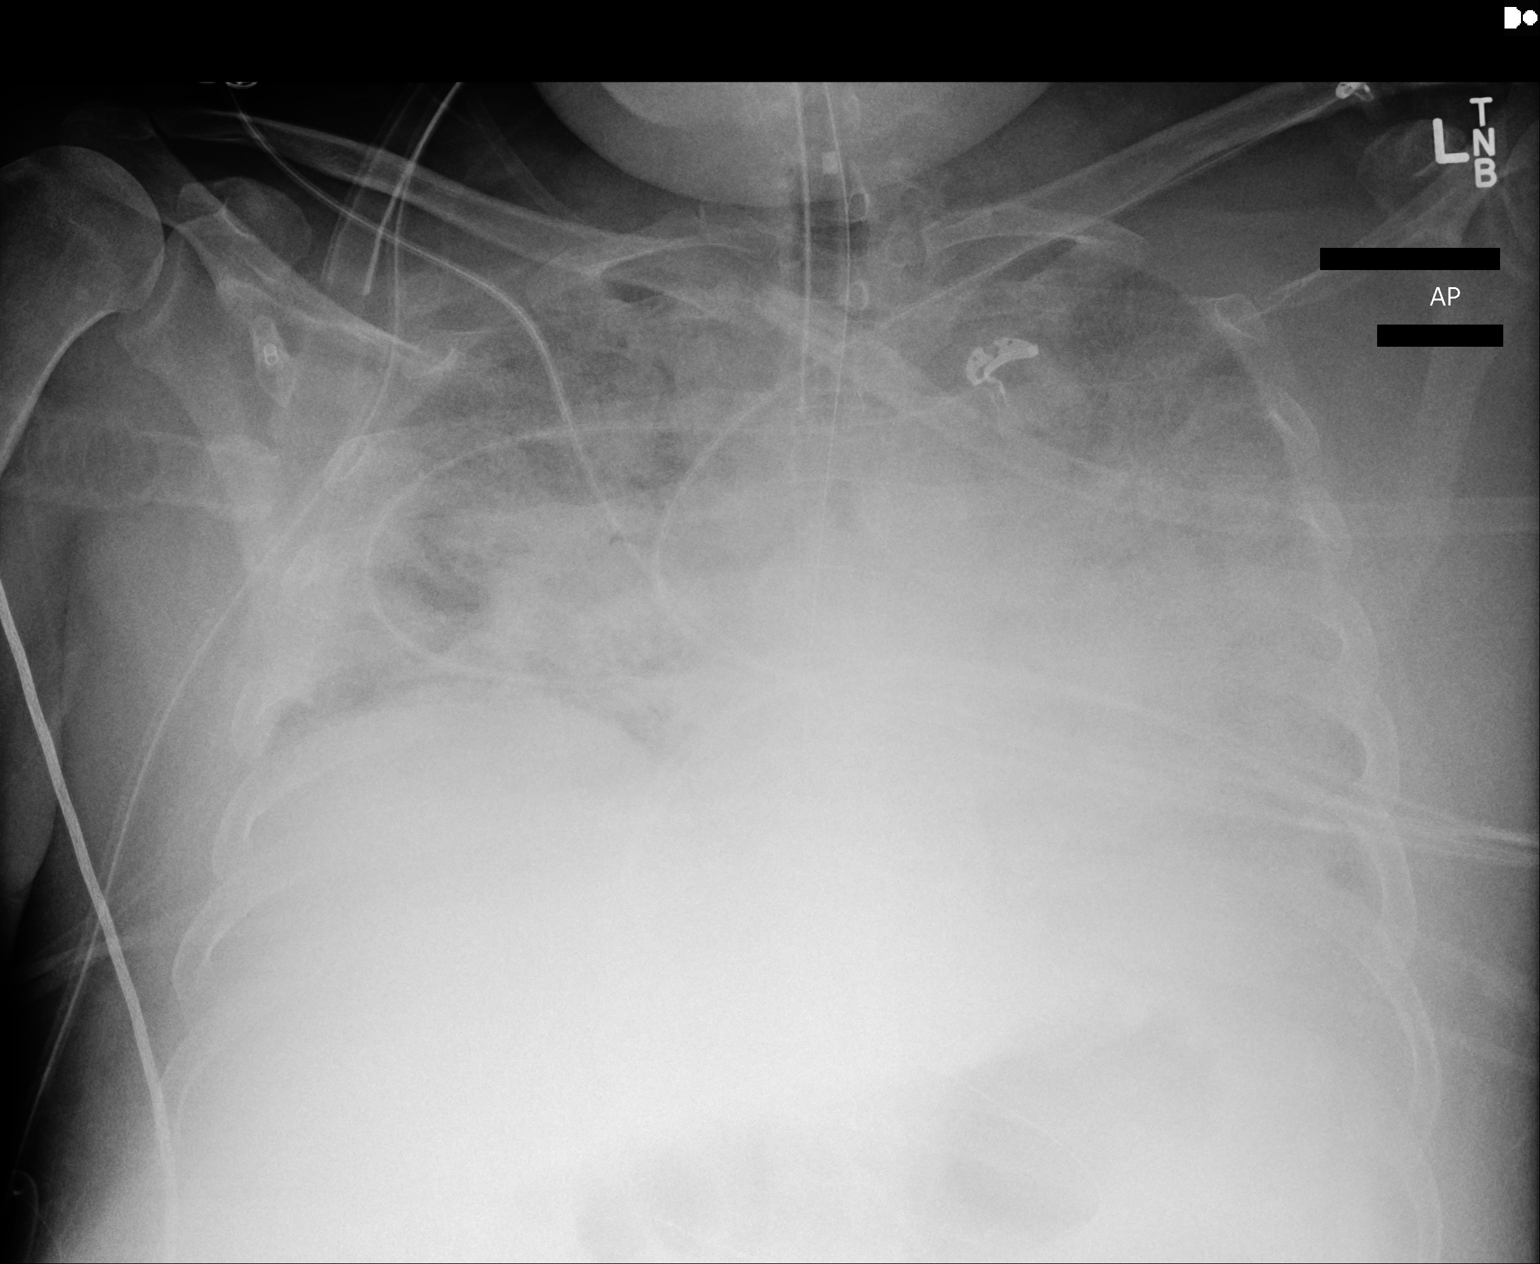

[1 of 1 positions shown; findings below may reference images not displayed]

FINDINGS: The heart size and mediastinal contours are stable. Endotracheal
tube is identified distal tip 3 cm from carina. Nasogastric tube is
identified distal tip not included but is at least in the stomach.
Diffuse opacities are identified throughout bilateral lungs which
may be slightly worsened compared to prior exam the visualized
skeletal structures are stable.
IMPRESSION: Diffuse airspace opacity throughout bilateral lungs, slightly
worsened compared to prior exam.
